# Patient Record
Sex: Female | Born: 1965 | Race: Black or African American | Hispanic: No | Marital: Single | Smoking: Never smoker
Health system: Southern US, Community
[De-identification: ages and names within clinical notes are randomized; demographics above are authoritative.]

## PROBLEM LIST (undated history)

## (undated) ENCOUNTER — Ambulatory Visit

---

## 1998-06-26 ENCOUNTER — Emergency Department (HOSPITAL_COMMUNITY): Admission: EM | Admit: 1998-06-26 | Discharge: 1998-06-26 | Payer: Self-pay | Admitting: Emergency Medicine

## 1998-11-05 ENCOUNTER — Encounter: Payer: Self-pay | Admitting: Emergency Medicine

## 1998-11-05 ENCOUNTER — Emergency Department (HOSPITAL_COMMUNITY): Admission: EM | Admit: 1998-11-05 | Discharge: 1998-11-05 | Payer: Self-pay | Admitting: Emergency Medicine

## 1999-03-10 ENCOUNTER — Other Ambulatory Visit: Admission: RE | Admit: 1999-03-10 | Discharge: 1999-03-10 | Payer: Self-pay | Admitting: Obstetrics

## 1999-05-10 ENCOUNTER — Ambulatory Visit (HOSPITAL_COMMUNITY): Admission: RE | Admit: 1999-05-10 | Discharge: 1999-05-10 | Payer: Self-pay | Admitting: Chiropractic Medicine

## 1999-05-10 ENCOUNTER — Encounter: Payer: Self-pay | Admitting: Chiropractic Medicine

## 1999-07-21 ENCOUNTER — Emergency Department (HOSPITAL_COMMUNITY): Admission: EM | Admit: 1999-07-21 | Discharge: 1999-07-21 | Payer: Self-pay

## 2003-01-03 ENCOUNTER — Emergency Department (HOSPITAL_COMMUNITY): Admission: EM | Admit: 2003-01-03 | Discharge: 2003-01-03 | Payer: Self-pay

## 2004-07-23 ENCOUNTER — Encounter: Payer: Self-pay | Admitting: Cardiovascular Disease

## 2004-07-23 ENCOUNTER — Inpatient Hospital Stay (HOSPITAL_COMMUNITY): Admission: EM | Admit: 2004-07-23 | Discharge: 2004-07-27 | Payer: Self-pay | Admitting: Emergency Medicine

## 2004-07-29 ENCOUNTER — Emergency Department (HOSPITAL_COMMUNITY): Admission: EM | Admit: 2004-07-29 | Discharge: 2004-07-29 | Payer: Self-pay | Admitting: Emergency Medicine

## 2004-08-02 ENCOUNTER — Inpatient Hospital Stay (HOSPITAL_COMMUNITY): Admission: EM | Admit: 2004-08-02 | Discharge: 2004-08-05 | Payer: Self-pay | Admitting: Emergency Medicine

## 2006-06-11 ENCOUNTER — Emergency Department (HOSPITAL_COMMUNITY): Admission: EM | Admit: 2006-06-11 | Discharge: 2006-06-11 | Payer: Self-pay | Admitting: Emergency Medicine

## 2008-01-21 ENCOUNTER — Ambulatory Visit (HOSPITAL_COMMUNITY): Admission: RE | Admit: 2008-01-21 | Discharge: 2008-01-21 | Payer: Self-pay | Admitting: Plastic Surgery

## 2008-01-22 ENCOUNTER — Encounter: Admission: RE | Admit: 2008-01-22 | Discharge: 2008-01-22 | Payer: Self-pay | Admitting: Plastic Surgery

## 2008-01-28 ENCOUNTER — Ambulatory Visit: Payer: Self-pay | Admitting: Vascular Surgery

## 2008-01-28 ENCOUNTER — Other Ambulatory Visit: Payer: Self-pay | Admitting: Emergency Medicine

## 2008-01-28 ENCOUNTER — Emergency Department (HOSPITAL_COMMUNITY): Admission: EM | Admit: 2008-01-28 | Discharge: 2008-01-28 | Payer: Self-pay | Admitting: Emergency Medicine

## 2008-01-28 ENCOUNTER — Encounter (INDEPENDENT_AMBULATORY_CARE_PROVIDER_SITE_OTHER): Payer: Self-pay | Admitting: Emergency Medicine

## 2008-01-28 ENCOUNTER — Ambulatory Visit (HOSPITAL_COMMUNITY): Admission: RE | Admit: 2008-01-28 | Discharge: 2008-01-28 | Payer: Self-pay | Admitting: Emergency Medicine

## 2008-01-31 ENCOUNTER — Inpatient Hospital Stay (HOSPITAL_COMMUNITY): Admission: EM | Admit: 2008-01-31 | Discharge: 2008-02-07 | Payer: Self-pay | Admitting: Emergency Medicine

## 2008-01-31 ENCOUNTER — Ambulatory Visit: Payer: Self-pay | Admitting: Surgery

## 2008-01-31 ENCOUNTER — Encounter (INDEPENDENT_AMBULATORY_CARE_PROVIDER_SITE_OTHER): Payer: Self-pay | Admitting: Internal Medicine

## 2008-03-28 ENCOUNTER — Ambulatory Visit: Payer: Self-pay | Admitting: Internal Medicine

## 2008-03-28 DIAGNOSIS — E669 Obesity, unspecified: Secondary | ICD-10-CM | POA: Insufficient documentation

## 2008-03-28 DIAGNOSIS — Z8672 Personal history of thrombophlebitis: Secondary | ICD-10-CM

## 2008-04-07 ENCOUNTER — Telehealth: Payer: Self-pay | Admitting: Internal Medicine

## 2008-04-11 ENCOUNTER — Ambulatory Visit: Payer: Self-pay | Admitting: Internal Medicine

## 2008-04-11 LAB — CONVERTED CEMR LAB
INR: 2.5
Prothrombin Time: 19.3 s

## 2008-05-08 ENCOUNTER — Ambulatory Visit: Payer: Self-pay | Admitting: Internal Medicine

## 2008-05-08 LAB — CONVERTED CEMR LAB
INR: 3.6
Prothrombin Time: 22.8 s

## 2008-05-17 ENCOUNTER — Encounter (INDEPENDENT_AMBULATORY_CARE_PROVIDER_SITE_OTHER): Payer: Self-pay | Admitting: Emergency Medicine

## 2008-05-17 ENCOUNTER — Ambulatory Visit: Payer: Self-pay | Admitting: Vascular Surgery

## 2008-05-17 ENCOUNTER — Emergency Department (HOSPITAL_COMMUNITY): Admission: EM | Admit: 2008-05-17 | Discharge: 2008-05-17 | Payer: Self-pay | Admitting: Emergency Medicine

## 2008-05-17 ENCOUNTER — Telehealth: Payer: Self-pay | Admitting: Family Medicine

## 2008-05-22 ENCOUNTER — Ambulatory Visit: Payer: Self-pay | Admitting: Internal Medicine

## 2008-05-22 LAB — CONVERTED CEMR LAB
INR: 2.2
Prothrombin Time: 18 s

## 2008-06-12 ENCOUNTER — Telehealth: Payer: Self-pay | Admitting: Internal Medicine

## 2008-06-12 ENCOUNTER — Ambulatory Visit: Payer: Self-pay | Admitting: Internal Medicine

## 2008-06-12 LAB — CONVERTED CEMR LAB
INR: 2.9
Prothrombin Time: 20.4 s

## 2008-07-10 ENCOUNTER — Ambulatory Visit: Payer: Self-pay | Admitting: Internal Medicine

## 2008-07-10 DIAGNOSIS — R5383 Other fatigue: Secondary | ICD-10-CM

## 2008-07-10 DIAGNOSIS — R5381 Other malaise: Secondary | ICD-10-CM

## 2008-07-10 LAB — CONVERTED CEMR LAB: TSH: 0.73 microintl units/mL (ref 0.35–5.50)

## 2009-02-03 ENCOUNTER — Encounter: Admission: RE | Admit: 2009-02-03 | Discharge: 2009-02-03 | Payer: Self-pay | Admitting: Plastic Surgery

## 2010-10-16 ENCOUNTER — Encounter: Payer: Self-pay | Admitting: Internal Medicine

## 2010-10-16 DIAGNOSIS — I82409 Acute embolism and thrombosis of unspecified deep veins of unspecified lower extremity: Secondary | ICD-10-CM

## 2011-02-15 NOTE — H&P (Signed)
NAMEGENISE, STRACK              ACCOUNT NO.:  [id number]   MEDICAL RECORD NO.:  [id number]          PATIENT TYPE:  EMS   LOCATION:  ED                           FACILITY:  [facility]   PHYSICIAN:  Lonia Blood, M.D.DATE OF BIRTH:  Nov 26, 1965   DATE OF ADMISSION:  01/31/2008  DATE OF DISCHARGE:                              HISTORY & PHYSICAL   PRIMARY CARE PHYSICIAN:  Unassigned.   CHIEF COMPLAINT:  Left lower extremity DVT with pain.   HISTORY OF PRESENT ILLNESS:  Ms. Kelly Flowers is a pleasant 45-year-  old obese female who underwent an elective tummy tuck and breast  reduction January 23, 2008, in [place].  In the postoperative period, she  began to note significant swelling in the left lower extremity.  She  presented to the [facility] emergency room January 28, 2008, at which time  she was diagnosed via Doppler with a left lower extremity DVT.  CT scan  was obtained and despite two attempts, suboptimal images were obtained.  The radiologist was able to comment that no very large central pulmonary  emboli were noted, but peripheral pulmonary emboli could not be ruled  out.  For reasons that are not all clear, the patient was discharge from  the ER on no anticoagulation and simply told to elevate her leg.  She  has followed these instructions, but not surprisingly her leg continues  to swell further, and she is describing severe pain of the leg.  She  therefore presented back to the emergency room tonight.  She  specifically denies chest pain but she does report short of breath that  is not normal for her.  She denies fevers, chills, nausea, vomiting,  hemoptysis, hematochezia, melena.  She denies severe pain at her  surgical sites and states that her wounds are healing nicely.   REVIEW OF SYSTEMS:  As comprehensive review of systems is unremarkable  with the exception of multiple positive elements noted in his present of  illness above.   PAST MEDICAL HISTORY:  1. Status  post abdominoplasty and breast reduction January 23, 2008.  2. Status post C-section  3. Status post umbilical hernia repair as a child.   OUTPATIENT MEDICATIONS:  Tylox p.r.n., Zofran p.r.n.   ALLERGIES:  NO KNOWN DRUG ALLERGIES.   FAMILY HISTORY:  The patient's mother died in her 76s due to coronary  artery disease and also had diabetes.  The patient's father is alive but  has hypertension.   SOCIAL HISTORY:  The patient does not smoke.  She does not drink.  She  lives in [place].  She is a day Occupational hygienist.   DATA REVIEWED:  Laboratory values are all pending at the present time.  CT scan of chest January 28, 2008:  Poor opacification of pulmonary  arterial tree, but no obvious central pulmonary embolism.   PHYSICAL EXAMINATION:  VITAL SIGNS:  Temperature 94, blood pressure  130/75, heart rate 91, respiratory 20, O2 sats 98% on room air.  GENERAL:  Well-developed, well-nourished obese female in no acute  respiratory distress.  LUNGS:  Clear to auscultation bilaterally without  wheezes or rhonchi.  CARDIOVASCULAR: Regular rate and rhythm without murmur, gallop or rub  with normal S1-S2.  ABDOMEN:  Obese, soft bowel sounds present.  No hepatosplenomegaly, no  rebound or ascites.  EXTREMITIES:  A 2+ bilateral lower extremity edema to the mid thighs.  There is pain to palpation across the left lower extremity.  There is no  appreciable erythema.  There is no cutaneous disruption/wound.   IMPRESSION AND PLAN:  1. Left lower extremity DVT with possible pulmonary embolism.  It is      not clear why this patient was sent out without anticoagulation.      Nonetheless, she clearly needs to be anticoagulated.  Not only does      she have a DVT, but we cannot conclusively rule out the possibility      of pulmonary embolism.  Given the patient's new shortness of breath      of approximately 2-3 days duration, I am concerned that there may      in fact be peripheral pulmonary emboli.  I am  placing the patient      on heparin drip for full anticoagulation.  She will need a minimum      of 3 months of full anticoagulation and possibly 6, should her VQ      scan be positive.  Given the fact that a CT angio of the chest was      accomplished x2 on January 28, 2008, and even with this attempt      pulmonary embolism cannot be ruled out, I will attempt to evaluate      the VQ scan.  2. Recent abdominoplasty and breast reduction.  The patient appears to      be suffering with no significant side effects from the surgery      apart from her DVT and possible PE.      Lonia Blood, M.D.  Electronically Signed     JTM/MEDQ  D:  01/31/2008  T:  01/31/2008  Job:  [id number]

## 2011-02-15 NOTE — Discharge Summary (Signed)
NAMEMALLORI, Kelly Flowers              ACCOUNT NO.:  [id number]   MEDICAL RECORD NO.:  [id number]          PATIENT TYPE:  INP   LOCATION:  1423                         FACILITY:  [facility]   PHYSICIAN:  Herbie Saxon, MDDATE OF BIRTH:  12/27/1965   DATE OF ADMISSION:  01/31/2008  DATE OF DISCHARGE:                               DISCHARGE SUMMARY   DISCHARGE DIAGNOSES:  1. Left leg deep venous thrombosis, intermediate probability pulmonary      embolus, on Lovenox.  2. Status post abdominoplasty and breast reduction on January 23, 2008.  3. Anemia, normocytic/normochromic.  4. Vaginal bleeding, stabilized.  5. Morbid obesity.   RADIOLOGY:  V/Q scan of January 31, 2008 shows multiple small subsegmental  perfusion defects representing intermediate probability of pulmonary  embolism.   Chest x-ray of January 31, 2008 shows cardiomegaly.  There is no heart  failure, no edema or effusion.   CT angiogram of January 28, 2008 showed small bilateral pleural effusions.  No overt pulmonary edema.  No obvious large central pulmonary embolus  seen.   HOSPITAL COURSE:  This 45 year old African-American lady was status post  abdominoplasty and breast reduction on January 23, 2008 in [place]  presented to the [facility] Emergency Room on January 28, 2008 with left  leg swelling with a Doppler showing a left leg DVT.  Patient was started  initially on IV heparin.  This was later changed to Lovenox.  The  patient was noticed to have transient vaginal bleeding for two days, but  this has subsided.   Patient does not have medical insurance coverage, and the clinical  social worker helped her to get assistance for Lovenox coverage, as the  surgeon contacted advised it would not be safe to start Coumadin until  10 days after this admission, so the patient has been kept on Lovenox  for 10 days and then will be bridged with Coumadin after the 10 days.  Goal INR is 2-3.  It is to monitored by the primary care  physician, Dr.  Lonia Blood, who will monitor the PT/INR twice weekly for the first two  weeks and then subsequently weekly with bleeding.   DISCHARGE INSTRUCTIONS:  Stable.   DIET:  Low cholesterol.   ACTIVITY:  Increase slowly as tolerated.   FOLLOW UP:  With Dr. Mikeal Hawthorne in three days.   MEDICATIONS ON DISCHARGE:  1. Lovenox 110 mg subcu q.12h.  2. Coumadin 5 mg nightly, to start on Feb 11, 2008.  3. Vicodin 5/500 1 q.6h. p.r.n.  4. Hemocyte Plus 1 daily.   PHYSICAL EXAMINATION:  On examination, she is a young lady not in acute  distress.  Temperature is 98, pulse 76, respiratory rate 18, blood pressure 113/78.  Pupils are equal and reactive to light and accommodation.  Head is  normocephalic and atraumatic.  She is clinically pale, not jaundiced.  Oropharynx and nasopharynx are clear.  NECK:  Supple.  CHEST:  Clinically clear.  Reduced breath sounds at the bases.  ABDOMEN:  Benign.  She has truncal obesity.  She is alert and oriented to time, place, and person.  Cranial nerves II-  XII are intact.  Power is 5 globally.  Peripheral pulses are present.  No pedal edema.  No calf tenderness.  Homans sign negative.  Deep tendon  reflexes 2+.   Patient is asked to follow up with her gynecologist as needed in the  next 1-2 weeks.  This will be arranged by the primary care physician.   AVAILABLE LABS:  WBC 6, hematocrit 26, platelet count 390.  Chemistry  shows a sodium of 137, potassium 3.8, chloride 103, bicarbonate 29,  glucose 102, BUN 10, creatinine 0.6.   Discharged within 30 minutes.      Herbie Saxon, MD  Electronically Signed     MIO/MEDQ  D:  02/07/2008  T:  02/07/2008  Job:  [id number]

## 2011-02-18 NOTE — Consult Note (Signed)
Kelly Flowers, Kelly Flowers              ACCOUNT NO.:  [id number]   MEDICAL RECORD NO.:  [id number]          PATIENT TYPE:  INP   LOCATION:  0350                         FACILITY:  [facility]   PHYSICIAN:  Thereasa Solo. Little, M.D. DATE OF BIRTH:  Sep 04, 1966   DATE OF CONSULTATION:  07/24/2004  DATE OF DISCHARGE:                                   CONSULTATION   REASON FOR CONSULTATION:  Chest pain.   This 45 year old female was admitted to [facility] on July 23, 2004.  She was admitted with chest pain.  About two days prior to this, she  had noticed a heavy tight sensation in her chest that was intermittent  in  nature.  It did not intensify with physical activity.  It began to radiate  into her upper shoulder and down her left arm and was associated with more  breathlessness.  She tried to go to the gym to do her normal activities, but  found herself markedly more breathless.  She presented to the emergency room  and since her admission she has had only mild recurrent chest discomfort,  but still is having some intermittent chest tightness on the day of this  consultation.  Her EKG has been normal and her cardiac enzymes have been  unremarkable.   A 2-D echocardiogram showed normal LV function, mild mitral regurgitation  and minimal calcification on the aortic valve.   She does not smoke cigarettes.  She is not hypertensive or diabetic, but she  does have a family history of a mother who died in her 2s of coronary  disease.   She has had no peripheral swelling.  Unaware of any arrhythmias.  She has  had no dizziness, no syncope and no prior history of a heart murmur or  rheumatic fever.   ALLERGIES:  None.   MEDICATIONS:  None.   FAMILY HISTORY:  Mother died in her 69s of an MI and was also a diabetic.  Father with hypertension.   SOCIAL HISTORY:  She has three children.  She runs a daycare center.  No  alcohol.  Exercises at a gym on a regular basis with no real  limitations.   REVIEW OF SYSTEMS:  Normal bowel habits.  No peripheral edema.  No  claudication complaints.  Mild back pain intermittently.   OUTPATIENT MEDICATIONS:  None.   PHYSICAL EXAMINATION:  VITAL SIGNS:  Blood pressure 109/73, pulse 65 and  regular, respiratory rate 18.  She is afebrile.  GENERAL APPEARANCE:  The patient is having no acute distress, but does have  intermittent episodes of mild chest discomfort.  She is pain-free at the  time of my evaluation.  SKIN:  Warm and dry.  NECK:  Supple with no carotid bruits.  Her thyroid is nonpalpable.  LUNGS:  Completely clear with no wheezing, rhonchi or rales.  CARDIAC:  Regular rhythm with no ectopic beats.  No rubs.  I did not  appreciate a murmur at the apex or in the axillae.  ABDOMEN:  Soft with no epigastric tenderness.  EXTREMITIES:  She has good pulses in the lower  extremities.  There is no  edema.  NEUROLOGIC:  Grossly intact.   LABORATORY DATA:  Review of her laboratory work shows her hemoglobin to be  11.7 and white blood cell count 5100.  BUN 8, creatinine 0.9.  CKs and  troponins x 3 have all been normal.  LDL is 61, HDL is 71.  Chest x-ray with  mild cardiomegaly.   ASSESSMENT:  Chest pain somewhat suspicious for angina, but in a 45 year old  with the only risk factor family history I cannot make the case to do a  cardiac catheterization.  I have scheduled her for a nuclear study and I  will keep her in the hospital until this is performed because of her  continued vague chest discomfort.   I have discussed this in detail with the patient and she understands the  plan.      ABL/MEDQ  D:  07/24/2004  T:  07/24/2004  Job:  [id number]

## 2011-02-18 NOTE — H&P (Signed)
Kelly Flowers, Kelly Flowers              ACCOUNT NO.:  [id number]   MEDICAL RECORD NO.:  [id number]          PATIENT TYPE:  EMS   LOCATION:  MAJO                         FACILITY:  [facility]   PHYSICIAN:  Isidor Holts, M.D.  DATE OF BIRTH:  25-Jul-1966   DATE OF ADMISSION:  08/02/2004  DATE OF DISCHARGE:                                HISTORY & PHYSICAL   PRIMARY MEDICAL DOCTOR:  Unassigned.   CARDIOLOGIST:  Dr. Gaspar Garbe B. Little.   CHIEF COMPLAINT:  Painful swelling, right leg, approximately 3-5 days.   HISTORY OF PRESENT ILLNESS:  This is a 45 year old African American female  admitted to [facility], July 23, 2004 to July 27, 2004, for  chest pain, subsequently found to be noncardiac.  She is status post  negative coronary angiogram, July 27, 2004.  She was discharged in  satisfactory condition and was asked to rest and then gradually resume  normal activities.  On July 28, 2004, she developed tingling of her  right leg, called Dr. Clarene Duke, who reassured her, as there was no obvious  swelling.  However, by July 29, 2004, the leg was now swollen and  painful.  She went to the emergency room and was prescribed  Keflex/analgesics for possible cellulitis.  The swelling continued, however,  and symptoms remained unabated.  She called Dr. Clarene Duke again on August 01, 2004 and was referred to the emergency room.  She denies chest pain or  shortness of breath.   PAST MEDICAL HISTORY:  1.  Noncardiac chest pain, July 22, 2004, admitted to [facility], July 23, 2004 to July 27, 2004, negative workup      including cardiac catheterization.  2.  Mild iron deficiency anemia.  3.  Low back pain.   MEDICATIONS:  1.  Hydrocodone/APAP.  2.  Keflex 500 mg p.o. 4 times daily started July 29, 2004.   ALLERGIES:  No known drug allergies.   SOCIAL HISTORY:  Lives in [place], has three offspring, denies alcohol  use, is a nonsmoker, has no history of  drug abuse.   FAMILY HISTORY:  Family history positive for coronary artery disease in her  mother, who was diabetic; her mother died in her 74s, status post MI.  Her  father has hypertension.   REVIEW OF SYSTEMS:  Systems review as per HPI and chief complaint,  otherwise, negative.   PHYSICAL EXAMINATION:  VITALS:  Temperature 97.0, pulse 75 per minute and  regular, respiratory rate 19, BP 121/73 mmHg, oxygen saturation 99% on room  air.  GENERAL:  The patient appears comfortable, alert, communicative, not short  of breath at rest.  HEENT:  No clinical pallor, no jaundice, no conjunctival injection.  Throat  is quite clear.  NECK:  Neck is supple with JVP not seen.  No palpable lymphadenopathy.  No  palpable goiter.  CHEST:  Chest is clinically clear to auscultation.  No wheezes or crackles.  CARDIAC:  Heart sounds 1 and 2 heard, normal.  No murmurs.  ABDOMEN:  Abdomen is full, soft and nontender.  There is no palpable  organomegaly, no palpable masses, normal bowel sounds.  EXTREMITIES:  Lower extremity examination:  No obvious bruising at the  femoral region.  Right thigh appears normal compared to the left.  Right  calf is swollen and tender.  There is no increased local temperature or  redness.   INVESTIGATIONS:  Arterial Doppler done in the emergency room showed no  evidence of aneurysm, reportedly.   CBC:  WBC 6.9, hemoglobin 11.6, hematocrit 33.4, platelets 241,000.  D-  dimers elevated at 1.62.   ASSESSMENT AND PLAN:  Clinically, features are consistent with right leg  deep venous thrombosis.  The main differential is of course a possible right  thigh hematoma, however, physical examination shows no clinical features to  support this.  She is being admitted for observation, workup and management.  We shall continue antibiotics for possible cellulitis, i.e., Ancef 1 g  intravenously q.6h. and start the patient on Lovenox in a weight-based  dosage.  Venous Doppler has  been arranged to rule out deep venous thrombosis  and further management will depend on the findings.      Chri   CO/MEDQ  D:  08/02/2004  T:  08/02/2004  Job:  [id number]   cc:   Thereasa Solo. Little, M.D.  [address]  [address]  [place]  [place] [address]  Fax: [phone]

## 2011-02-18 NOTE — H&P (Signed)
NAME:  Kelly Flowers, Kelly Flowers              ACCOUNT NO.:  [id number]   MEDICAL RECORD NO.:  [id number]          PATIENT TYPE:  EMS   LOCATION:  ED                           [facility]AC[facility]   PHYSICIAN:  Lonia Blood, M.D.      DATE OF BIRTH:  December 11, 1965   DATE OF ADMISSION:  07/22/2004  DATE OF DISCHARGE:                                HISTORY & PHYSICAL   PRIMARY CARE PHYSICIAN:  The patient is unassigned.   PRESENTING COMPLAINT:  Chest pain for two days.   HISTORY OF PRESENT ILLNESS:  This is a 45 year old, African-American female  with no significant past medical history who presented with chest pain for  the past two days.  The patient described pain as 7/10, mainly in the  precordial region.  Described it more as a squeezing rather than sharp,  occasionally radiating down her left arm.  Has some elements of diaphoresis  with some nausea.  Pain is present both at rest as well as with exertion.  She denied any relieving or aggravating factor.  Associated with some  shortness of breath with exertion today.  However, that has since resolved.  The patient also described some elements of pain radiating to the back and  occasionally with deep inspiration.  This is the first ever episode of chest  pain in the patient.  Her risk factors for cardiac disease include family  history, unknown cholesterol status, as well as obesity.   PAST MEDICAL HISTORY:  Mainly significant for low back pain.  Denied any  history of diabetes or hypertension.   ALLERGIES:  The patient has no known drug allergies.   FAMILY HISTORY:  Her mother died in her 4s from MI and also had diabetes.  Her father has hypertension.   SOCIAL HISTORY:  The patient lives in [place] with her three kids.  Denied any tobacco use or alcohol use.  The patient does go to the gym  occasionally.  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   REVIEW OF SYSTEMS:  GENERAL:  The patient denied any weight gain or weight  loss.   Denied any fever.  CHEST:  Mainly as in HPI.  HEENT:  The patient  denied any diplopia, any jaundice, or any discoloration of the eye.  Also  denied any problem with her throat swallowing function.  CARDIOVASCULAR:  Mainly shortness of breath.  No palpitations, no orthopnea, no PND.  ABDOMEN:  The patient denied any diarrhea or constipation.  Mainly nausea,  as mentioned, but no vomiting, no hematochezia, no melena.  GENITOURINARY:  The patient denied any discharge.  No frequency.  No other urinary symptoms.  MUSCULOSKELETAL:  The patient denied any joint pain, any leg swelling or  muscle aches.   PHYSICAL EXAMINATION:  TEMPERATURE:  97.3.  BLOOD PRESSURE:  120/80.  PULSE:  71.  RESPIRATORY RATE:  20.  SATURATIONS:  97% on room air.  GENERAL:  The patient is obese, with a BMI of 42, stable, conversant, in no  acute distress.  NECK:  Supple, with no JVD, no lymphadenopathy.  HEENT:  PERRL.  EOMI.  CHEST:  Has good air entry bilaterally.  No crackles, no rales, no wheeze.  CARDIOVASCULAR:  Regular S1 and S2, sinus regular.  No audible rubs.  ABDOMEN:  Obese, nontender, with positive bowel sounds.  GENITOURINARY:  Deferred.  EXTREMITIES:  Showed no edema, cyanosis, or clubbing.  SKIN:  Warm and moist.  NEUROLOGIC:  Nonfocal.   LABORATORIES:  White count 5.1, hemoglobin 11.7, platelet count 286.  Sodium  113, potassium 3.6, chloride 108, CO2 27, glucose 93, BUN 8, creatinine 0.9,  calcium 8.8.  Initial cardiac enzymes showed negative, with a troponin of  less than 0.05 x 2.  Her EKG showed elements of sinus bradycardia, with a  rate of 58.  Her PR interval is mildly elevated to 226, indicating first-  degree AV block.  Normal QRS duration.  Normal QT interval.  Diffuse ST  segment elevation all over the precordium, which may represent either an ST  elevation or preexcitation.   ASSESSMENT:  Therefore, this is a 45 year old family with family history  significant for early coronary  artery disease, unknown cholesterol status,  as well as obesity, who presented with unrelenting chest pain, mainly in the  precordium.  Also, the patient has subtle EKG changes that may represent  some elements of pericarditis or normal findings.  The most likely diagnosis  in the patient would be pericarditis.  She works in a day care center, which  may have exposed her to some viral illness.  The patient denied any exposure  or recent viral illness.  However, this may not be uncommon.  Other  likelihood is an MI, as patient with strong cardiac risk factors.  Will  therefore admit patient at least to rule out MI in the patient.   PLAN:  1.  Chest pain.  Admit to telemetry.  Check cardiac enzymes serially to rule      out MI.  Repeat EKG in the morning.  Obtain 2D echocardiogram, both for      patient's chest x-ray that showed mild cardiomegaly, as well as rule out      pericarditis in this case.  Meanwhile, treat empirically with some IV      morphine, aspirin, and give her some NSAID like ibuprofen t.i.d. at      least for possible pericarditis.  Cover with some Protonix for her GI      protection on these drugs.  I will also go ahead and give her some      nitroglycerin, since her pain seems to be relieved.  However, because of      her borderline blood pressure, try and use either sublingual or use the      nitro paste.  I will also load with beta blocker on a small dose, also      to be held based on her blood pressure responses.  In the meantime,      however, I will avoid using heparin in patient in case it turns out that      she has pericarditis.  Will consider calling GI in the morning for      further risk stratification in case the patient turns out not to have      pericarditis and she has negative cardiac enzymes.  2.  Mild anemia.  The patient's hemoglobin is 11.7.  However, the patient is      a menstruating young lady.  This could be responsible for this.  I will  therefore not make any significant change to her current medications      since anemia is mild.  That could be worked up as an outpatient, unless      evidence of drop in her hemoglobin.  3.  Obesity.  The patient is mildly obese.  However, the patient says that      she has been participating in some exercise at the gym.  Will try and      encourage the patient to continue with that.  Meanwhile, will check her      fasting lipid panel during this hospitalization, and as mentioned will      proceed with some risk stratification.      LG/MEDQ  D:  07/23/2004  T:  07/23/2004  Job:  [id number]

## 2011-02-18 NOTE — Discharge Summary (Signed)
Kelly Flowers, Kelly Flowers              ACCOUNT NO.:  [id number]   MEDICAL RECORD NO.:  [id number]          PATIENT TYPE:  INP   LOCATION:  3728                         FACILITY:  [facility]   PHYSICIAN:  Jonna L. Robb Matar, M.D.DATE OF BIRTH:  [birth year]   DATE OF ADMISSION:  07/23/2004  DATE OF DISCHARGE:  07/27/2004                                 DISCHARGE SUMMARY   PRIMARY CARE PHYSICIAN:  Unassigned.   CARDIOLOGY:  [facility].   FINAL DIAGNOSES:  1.  Noncardiac chest pain.  2.  Mild iron deficiency anemia.   PROCEDURE:  Cardiac catheterization on October 24.   ALLERGIES:  None.   CODE STATUS:  Full.   HISTORY OF PRESENT ILLNESS:  This 45 year old nonsmoker developed precordial  chest pain radiating down her left arm with some nausea, diaphoresis, some  dyspnea on exertion the day of admission.  She had a positive family history  of her mother dying in her 58s from an MI and diabetes.  Father had  hypertension; however, the patient herself has neither of those risk factors  and does not smoke.   PHYSICAL EXAMINATION:  Notable for a BMI of 42 and is otherwise  unremarkable.   LABORATORY DATA:  Cardiac enzymes were negative.  EKG showed mild sinus  bradycardia to first-degree heart block, diffuse ST segment mild elevations  which on examination is just a high J point.   HOSPITAL COURSE:  The patient was admitted to telemetry.  She was treated  with morphine, aspirin, anti-inflammatories, PPI, and low-dose beta blocker.  She was seen in consultation by [facility].  Catheterization on  October 24 was completely negative.  Telemetry was unremarkable.   DISPOSITION:  The patient will be discharged on coated aspirin 81 mg daily,  omeprazole 20 mg daily p.r.n.  He is on a vegetarian diet, has lost 50  pounds, and exercises, though I have told her to continue all of her good  health habits.  I have also suggested that she arrange to get a PCP.      JLB/MEDQ  D:  07/27/2004  T:  07/27/2004  Job:  [id number]

## 2011-02-18 NOTE — Cardiovascular Report (Signed)
NAMEMARLI, Kelly Flowers              ACCOUNT NO.:  [id number]   MEDICAL RECORD NO.:  [id number]          PATIENT TYPE:  INP   LOCATION:  3731                         FACILITY:  [facility]   PHYSICIAN:  Thereasa Solo. Little, M.D. DATE OF BIRTH:  1965/10/30   DATE OF PROCEDURE:  07/26/2004  DATE OF DISCHARGE:                              CARDIAC CATHETERIZATION   INDICATIONS FOR TEST:  This 45 year old female was admitted to [facility], July 22, 2004, with chest pain.  Her chest pain was  intermittent in nature but squeezing, radiating up into her left shoulder  and down her left arm.  It was plus/minus worse with exertion.  She had been  able to exercise at the gym days prior to this with no problems but has  noticed a slight change and some mild dyspnea on exertion.  She has a family  history of heart disease.  Her mother died at age 59 of a myocardial  infarction.  This lady does not smoke, is not diabetic or hypertensive.  Her  lipid panel is appropriate.  And she continued to have chest pain  intermittently throughout her hospitalization.   The patient was prepped and draped in the usual sterile fashion after  obtaining informed consent.  Local anesthetic with 1% Xylocaine was  infiltrated into the right femoral area, and a 5 French introducer sheath  was placed into the right femoral artery.  Left and right coronary  arteriography and ventriculography in the RAO projection was performed.   COMPLICATIONS:  None.   EQUIPMENT:  5 French Judkins configuration catheters.   RESULTS:  1.  Hemodynamic monitoring central aortic pressure 120/74.  2.  Left ventricular pressure 120/3 with no aortic valve gradient noted at      time of pullback.  3.  Ventriculography.  Ventriculography in the RAO projection using 25 mL of      contrast at 12 mL per second showed normal LV systolic function,      ejection fraction in excess of 55%.  The left ventricular end-diastolic      pressure was  9.  PVCs were noted during the ventriculogram.  4.  Coronary arteriography:  No calcification was seen on fluoroscopy.   1.  Left main normal.  2.  LAD.  The LAD crossed the apex of the heart and gave rise to a moderate      size first diagonal vessel.  It was all free of disease.  3.  Circumflex.  The circumflex was a large left dominant system, had 3 OMs      and a PDA, all of which were free of disease.  4.  Right coronary artery.  The right coronary artery gave rise to a large      RV branch but was still nondominant.  There was no significant disease.      There was minimal coronary spasm - catheter-induced noted.  This      resolved, and cuff shot showed no proximal lesion.   CONCLUSION:  1.  Normal left ventricular systolic function.  2.  No evidence of coronary artery  disease.   At this point, I cannot explain her chest pain based on her coronary  anatomy.  She should be ready for discharge later today if [facility] team  feels this is appropriate.  She will be reevaluated tomorrow from a cardiac  cath/groin standpoint with regular follow up with her primary care doctor.       ABL/MEDQ  D:  07/26/2004  T:  07/26/2004  Job:  [id number]   cc:   [facility]   Cath Lab

## 2011-02-18 NOTE — Discharge Summary (Signed)
Kelly Flowers, Kelly Flowers              ACCOUNT NO.:  [id number]   MEDICAL RECORD NO.:  [id number]          PATIENT TYPE:  INP   LOCATION:  5040                         FACILITY:  [facility]   PHYSICIAN:  Michaelyn Barter, M.D. DATE OF BIRTH:  [birth year]   DATE OF ADMISSION:  08/02/2004  DATE OF DISCHARGE:  08/05/2004                                 DISCHARGE SUMMARY   CHIEF COMPLAINT:  Swelling and pain within the right leg for approximately 3-  5 days.   HISTORY OF PRESENT ILLNESS:  Kelly Flowers is a 45 year old female recently  discharged from [facility] on October 25 secondary to undergoing a  cardiac catheterization for evaluation of chest pain.  She states that  following her discharge from the hospital on October 26, she developed  tingling of her right leg.  She called Dr. Clarene Duke, her cardiologist, for  reassurance.  On October 27, the following day, her leg began to swell and  was painful.  She went to the ER and was prescribed Keflex, along with  analgesics for a possible cellulitis.  However, the swelling continued, and  again, Dr. Clarene Duke was called.  At that time, she was referred to the  emergency room for further evaluation.   PAST MEDICAL HISTORY:  1.  Mild iron deficiency anemia.  2.  Low back pain.  3.  Noncardiac chest pain.   PAST SURGICAL PROCEDURES:  Cardiac catheterization completed on October 24.   ALLERGIES:  No known drug allergies.   SOCIAL HISTORY:  The patient denies alcohol.  She also denies cigarettes.   FAMILY HISTORY:  Mother coronary artery disease and diabetes.  Died in her  81's of an MI.  Father has history of hypertension.   HOSPITAL COURSE:  The patient was admitted into the hospital with a  differential diagnosis of right leg pain secondary to deep vein thrombosis  versus cellulitis.  She was treated empirically with IV antibiotics for  possible cellulitis.  She was given Ancef 1 g IV q.6h.  Likewise, she was  also started on Lovenox  therapy for a possible deep vein thrombosis.  On  October 31, the patient underwent a venous Duplex of the lower extremity,  the final impression of which was no evidence of DVT, SVT or Baker's cyst  bilaterally.  Later that day, the patient reported that she noted the  swelling within her leg had begun to decrease.  However, she did report pain  still.  By November 2, the patient stated that her leg was starting to feel  much better.  The pain had decreased, and she actually had begun to walk  around.  Today, the day of discharge, she states that she feels much better.  She states that her pain is almost completely controlled with the current  pain medication, and she is ready to go home.  Her condition at the time of  discharge is improved.  Her right leg appears to be less swollen than  previously.  Her vitals today:  Temperature 98.4, heart rate 73,  respirations 18, blood pressure 100/69.  The patient will be  discharged home  today.   DISCHARGE MEDICATIONS:  1.  Augmentin 875 mg p.o. b.i.d. which she will take for 10 days.  2.  Ultram 50 mg p.o. q.8h, which she will take for pain.   DISCHARGE INSTRUCTIONS:  The patient is instructed to find a primary care  physician for further evaluation and follow up, to get plenty of rest and to  drink plenty of fluids.      Orla   OR/MEDQ  D:  08/05/2004  T:  08/06/2004  Job:  [id number]   cc:   Thereasa Solo. Little, M.D.  [address]  [address]  [place]  [place] [address]  Fax: [phone]

## 2011-06-28 LAB — DIFFERENTIAL
Basophils Absolute: 0
Basophils Absolute: 0
Basophils Relative: 0
Basophils Relative: 0
Eosinophils Absolute: 0.1
Eosinophils Absolute: 0.2
Eosinophils Relative: 2
Eosinophils Relative: 3
Lymphocytes Relative: 28
Lymphocytes Relative: 21
Lymphs Abs: 2.1
Lymphs Abs: 1.3
Monocytes Absolute: 0.6
Monocytes Absolute: 0.1
Monocytes Relative: 8
Monocytes Relative: 2 — ABNORMAL LOW
Neutro Abs: 4.6
Neutro Abs: 4.8
Neutrophils Relative %: 62
Neutrophils Relative %: 75

## 2011-06-28 LAB — CBC
HCT: 28.7 — ABNORMAL LOW
HCT: 29.5 — ABNORMAL LOW
Hemoglobin: 9.2 — ABNORMAL LOW
Hemoglobin: 9.4 — ABNORMAL LOW
MCHC: 32.2
MCHC: 31.9
MCV: 88.4
MCV: 89.1
Platelets: 370
Platelets: 343
RBC: 3.25 — ABNORMAL LOW
RBC: 3.31 — ABNORMAL LOW
RDW: 13.6
RDW: 13.7
WBC: 7.4
WBC: 6.5

## 2011-06-28 LAB — BASIC METABOLIC PANEL
BUN: 10
BUN: 4 — ABNORMAL LOW
CO2: 29
CO2: 29
Calcium: 8.8
Calcium: 8.6
Chloride: 103
Chloride: 106
Creatinine, Ser: 0.64
Creatinine, Ser: 0.76
GFR calc Af Amer: >60
GFR calc Af Amer: >60
GFR calc non Af Amer: >60
GFR calc non Af Amer: >60
Glucose, Bld: 102 — ABNORMAL HIGH
Glucose, Bld: 100 — ABNORMAL HIGH
Potassium: 3.8
Potassium: 3.6
Sodium: 137
Sodium: 139

## 2011-06-28 LAB — HEPARIN LEVEL (UNFRACTIONATED): Heparin Unfractionated: 1.18 — ABNORMAL HIGH

## 2011-06-28 LAB — PROTIME-INR
INR: 1.1
Prothrombin Time: 14.3

## 2011-06-28 LAB — APTT: aPTT: 53 — ABNORMAL HIGH

## 2011-06-28 LAB — D-DIMER, QUANTITATIVE: D-Dimer, Quant: 1.47 — ABNORMAL HIGH

## 2013-04-09 ENCOUNTER — Ambulatory Visit (INDEPENDENT_AMBULATORY_CARE_PROVIDER_SITE_OTHER): Payer: Self-pay | Admitting: General Practice

## 2016-10-06 ENCOUNTER — Other Ambulatory Visit: Payer: Self-pay | Admitting: Internal Medicine

## 2016-10-06 DIAGNOSIS — Z1231 Encounter for screening mammogram for malignant neoplasm of breast: Secondary | ICD-10-CM

## 2016-10-12 ENCOUNTER — Ambulatory Visit
Admission: RE | Admit: 2016-10-12 | Discharge: 2016-10-12 | Disposition: A | Discharge status: Home / Self Care | Payer: PRIVATE HEALTH INSURANCE | Source: Ambulatory Visit | Attending: Internal Medicine | Admitting: Internal Medicine

## 2016-10-12 DIAGNOSIS — Z1231 Encounter for screening mammogram for malignant neoplasm of breast: Secondary | ICD-10-CM

## 2016-10-13 ENCOUNTER — Ambulatory Visit: Payer: Self-pay

## 2018-07-14 ENCOUNTER — Encounter (HOSPITAL_COMMUNITY): Payer: Self-pay | Admitting: *Deleted

## 2018-07-14 ENCOUNTER — Emergency Department (HOSPITAL_COMMUNITY)
Admission: EM | Admit: 2018-07-14 | Discharge: 2018-07-14 | Disposition: A | Discharge status: Home / Self Care | Payer: Self-pay | Attending: Emergency Medicine | Admitting: Emergency Medicine

## 2018-07-14 ENCOUNTER — Emergency Department (HOSPITAL_COMMUNITY): Payer: Self-pay

## 2018-07-14 DIAGNOSIS — H1032 Unspecified acute conjunctivitis, left eye: Secondary | ICD-10-CM | POA: Insufficient documentation

## 2018-07-14 DIAGNOSIS — Z7901 Long term (current) use of anticoagulants: Secondary | ICD-10-CM | POA: Insufficient documentation

## 2018-07-14 DIAGNOSIS — R042 Hemoptysis: Secondary | ICD-10-CM | POA: Insufficient documentation

## 2018-07-14 LAB — CBC WITH DIFFERENTIAL/PLATELET
Abs Immature Granulocytes: 0.02 10*3/uL (ref 0.00–0.07)
BASOS ABS: 0 10*3/uL (ref 0.0–0.1)
Basophils Relative: 0 %
EOS PCT: 3 %
Eosinophils Absolute: 0.1 10*3/uL (ref 0.0–0.5)
HCT: 40.5 % (ref 36.0–46.0)
HEMOGLOBIN: 12.4 g/dL (ref 12.0–15.0)
Immature Granulocytes: 0 %
LYMPHS PCT: 46 %
Lymphs Abs: 2.2 10*3/uL (ref 0.7–4.0)
MCH: 28.4 pg (ref 26.0–34.0)
MCHC: 30.6 g/dL (ref 30.0–36.0)
MCV: 92.7 fL (ref 80.0–100.0)
Monocytes Absolute: 0.4 10*3/uL (ref 0.1–1.0)
Monocytes Relative: 8 %
NEUTROS PCT: 43 %
Neutro Abs: 2 10*3/uL (ref 1.7–7.7)
Platelets: 230 10*3/uL (ref 150–400)
RBC: 4.37 MIL/uL (ref 3.87–5.11)
RDW: 13.5 % (ref 11.5–15.5)
WBC: 4.8 10*3/uL (ref 4.0–10.5)
nRBC: 0 % (ref 0.0–0.2)

## 2018-07-14 LAB — BASIC METABOLIC PANEL
Anion gap: 8 (ref 5–15)
BUN: 16 mg/dL (ref 6–20)
CALCIUM: 9.1 mg/dL (ref 8.9–10.3)
CO2: 28 mmol/L (ref 22–32)
CREATININE: 0.89 mg/dL (ref 0.44–1.00)
Chloride: 107 mmol/L (ref 98–111)
GFR calc non Af Amer: >60 mL/min (ref >60)
Glucose, Bld: 102 mg/dL — ABNORMAL HIGH (ref 70–99)
Potassium: 3.9 mmol/L (ref 3.5–5.1)
SODIUM: 143 mmol/L (ref 135–145)

## 2018-07-14 MED ORDER — ERYTHROMYCIN 5 MG/GM OP OINT: TOPICAL_OINTMENT | OPHTHALMIC | 0 refills | Status: DC

## 2018-07-14 MED ORDER — TETRACAINE HCL 0.5 % OP SOLN
2.0000 [drp] | Freq: Once | OPHTHALMIC | Status: AC
  Administered 2018-07-14: 2 [drp] via OPHTHALMIC
  Filled 2018-07-14: qty 4

## 2018-07-14 MED ORDER — FLUORESCEIN SODIUM 1 MG OP STRP
1.0000 | ORAL_STRIP | Freq: Once | OPHTHALMIC | Status: AC
  Administered 2018-07-14: 1 via OPHTHALMIC
  Filled 2018-07-14: qty 1

## 2018-07-14 MED ORDER — ERYTHROMYCIN 5 MG/GM OP OINT
TOPICAL_OINTMENT | Freq: Once | OPHTHALMIC | Status: AC
  Administered 2018-07-14: 1 via OPHTHALMIC
  Filled 2018-07-14: qty 3.5

## 2018-07-14 NOTE — ED Provider Notes (Signed)
[facility]-EMERGENCY DEPT Provider Note   CSN: [id number] Arrival date & time: 07/14/18  7829     History   Chief Complaint Chief Complaint  Patient presents with  . Eye Pain    HPI Kelly Flowers is a 52 y.o. female had previous DVT off anticoagulation here presenting with left eye redness, spitting up blood-tinged sputum.  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  She denies any trauma to the eye or eye injury.  She does not wear any contact lenses.  Since yesterday, patient has been having some left eye redness.  She woke up this morning and has some discharge from the eye and blurry vision as well as eyelid swelling.  Also for the last several days she noticed spitting up some blood-tinged sputum.  She denies any coughing or vomiting.  Denies any trouble breathing or shortness of breath or chest pain.  Denies any leg swelling or calf pain.   The history is provided by the patient.    History reviewed. No pertinent past medical history.  Patient Active Problem List   Diagnosis Date Noted  . 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 FATIGUE 07/10/2008  . OBESITY 03/28/2008  . DEEP VENOUS THROMBOPHLEBITIS, LEFT, LEG, HX OF 03/28/2008    History reviewed. No pertinent surgical history.   OB History   None      Home Medications    Prior to Admission medications   Medication Sig Start Date End Date Taking? Authorizing Provider  warfarin (COUMADIN) 7.5 MG tablet Take 7.5 mg by mouth daily.      [provider]    Family History No family history on file.  Social History Social History   Tobacco Use  . Smoking status: Never Smoker  . Smokeless tobacco: Never Used  Substance Use Topics  . Alcohol use: Never    Frequency: Never  . Drug use: Not on file     Allergies   Patient has no allergy information on record.   Review of Systems Review of Systems  Eyes: Positive for  discharge and redness.  Respiratory: Positive for cough.   All other systems reviewed and are negative.    Physical Exam Updated Vital Signs BP (!) 147/96 (BP Location: Right Arm)   Pulse 63   Temp 98.1 F (36.7 C) (Oral)   Resp 18   SpO2 98%   Physical Exam  Constitutional: She is oriented to person, place, and time. She appears well-developed and well-nourished.  HENT:  Head: Normocephalic.  Mouth/Throat: Oropharynx is clear and moist.  Eyes: Pupils are equal, round, and reactive to light.  L conjunctiva slightly erythematous. No obvious corneal abrasion under fluorescein stain. Vision 20/20 bilaterally. Mild L upper and lower eyelid swelling, extra ocular movements intact   Cardiovascular: Normal rate, regular rhythm and normal heart sounds.  Pulmonary/Chest: Effort normal and breath sounds normal. No stridor. No respiratory distress. She has no wheezes.  Abdominal: Soft. Bowel sounds are normal. She exhibits no distension. There is no tenderness.  Musculoskeletal: Normal range of motion.  Neurological: She is alert and oriented to person, place, and time. No cranial nerve deficit. Coordination normal.  Skin: Skin is warm.  Psychiatric: She has a normal mood and affect.  Nursing note and vitals reviewed.    ED Treatments / Results  Labs (all labs ordered are listed, but only abnormal results are displayed) Labs Reviewed  CBC WITH DIFFERENTIAL/PLATELET  BASIC METABOLIC PANEL    EKG None  Radiology No results found.  Procedures Procedures (including critical care time)  Medications Ordered in ED Medications  erythromycin ophthalmic ointment (has no administration in time range)  tetracaine (PONTOCAINE) 0.5 % ophthalmic solution 2 drop (2 drops Right Eye Given 07/14/18 0758)  fluorescein ophthalmic strip 1 strip (1 strip Left Eye Given 07/14/18 0758)     Initial Impression / Assessment and Plan / ED Course  I have reviewed the triage vital signs and the  nursing notes.  Pertinent labs & imaging results that were available during my care of the patient were reviewed by me and considered in my medical decision making (see chart for details).    Kelly Flowers is a 52 y.o. female here with L eye pain and redness, cough with blood tinged sputum. No corneal abrasion on fluorescein stain, vision normal. I think likely viral vs early bacterial conjunctivitis. No evidence of periorbital or orbital cellulitis. 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 Patient not tachycardic or hypoxic. I think likely viral bronchitis and I doubt PE. Will get cbc, CXR.     9:12 AM CBC normal, CXR clear. Given erythromycin ointment. Stable for discharge   Final Clinical Impressions(s) / ED Diagnoses   Final diagnoses:  None    ED Discharge Orders    None       Charlynne Pander, MD 07/14/18 [phone]

## 2018-07-14 NOTE — Discharge Instructions (Signed)
Use erythromycin ointment three times daily for 5 days.   No driving for 2 days   See your doctor   Return to ER if you have worse eye redness and purulent drainage, trouble breathing, more blood in sputum

## 2018-07-14 NOTE — ED Triage Notes (Signed)
Pt complains of left eye pain since waking up this morning. Pt tried massaging eye with a cold rag, w/o relief. Pt states she also spit into the sink and saw blood. Pt states her mouth is dry. Pt states her vision is blurry and has difficulty opening eye. Pt denies drainage from eye.

## 2019-02-26 ENCOUNTER — Ambulatory Visit (HOSPITAL_BASED_OUTPATIENT_CLINIC_OR_DEPARTMENT_OTHER)
Admission: RE | Admit: 2019-02-26 | Discharge: 2019-02-26 | Disposition: A | Discharge status: Home / Self Care | Payer: 59 | Source: Ambulatory Visit | Attending: Emergency Medicine | Admitting: Emergency Medicine

## 2019-02-26 ENCOUNTER — Emergency Department (HOSPITAL_COMMUNITY)
Admission: EM | Admit: 2019-02-26 | Discharge: 2019-02-26 | Disposition: A | Discharge status: Home / Self Care | Payer: 59 | Attending: Emergency Medicine | Admitting: Emergency Medicine

## 2019-02-26 ENCOUNTER — Other Ambulatory Visit: Payer: Self-pay

## 2019-02-26 ENCOUNTER — Emergency Department (HOSPITAL_COMMUNITY): Payer: 59

## 2019-02-26 ENCOUNTER — Encounter (HOSPITAL_COMMUNITY): Payer: Self-pay

## 2019-02-26 DIAGNOSIS — R079 Chest pain, unspecified: Secondary | ICD-10-CM | POA: Insufficient documentation

## 2019-02-26 DIAGNOSIS — M79609 Pain in unspecified limb: Secondary | ICD-10-CM

## 2019-02-26 DIAGNOSIS — M79605 Pain in left leg: Secondary | ICD-10-CM | POA: Diagnosis not present

## 2019-02-26 LAB — CBC WITH DIFFERENTIAL/PLATELET
Abs Immature Granulocytes: 0.01 10*3/uL (ref 0.00–0.07)
Basophils Absolute: 0 10*3/uL (ref 0.0–0.1)
Basophils Relative: 0 %
Eosinophils Absolute: 0.1 10*3/uL (ref 0.0–0.5)
Eosinophils Relative: 2 %
HCT: 38.6 % (ref 36.0–46.0)
Hemoglobin: 12.3 g/dL (ref 12.0–15.0)
Immature Granulocytes: 0 %
Lymphocytes Relative: 46 %
Lymphs Abs: 2.3 10*3/uL (ref 0.7–4.0)
MCH: 29.3 pg (ref 26.0–34.0)
MCHC: 31.9 g/dL (ref 30.0–36.0)
MCV: 91.9 fL (ref 80.0–100.0)
Monocytes Absolute: 0.5 10*3/uL (ref 0.1–1.0)
Monocytes Relative: 9 %
Neutro Abs: 2.2 10*3/uL (ref 1.7–7.7)
Neutrophils Relative %: 43 %
Platelets: 227 10*3/uL (ref 150–400)
RBC: 4.2 MIL/uL (ref 3.87–5.11)
RDW: 13.2 % (ref 11.5–15.5)
WBC: 5.1 10*3/uL (ref 4.0–10.5)
nRBC: 0 % (ref 0.0–0.2)

## 2019-02-26 LAB — BASIC METABOLIC PANEL
Anion gap: 9 (ref 5–15)
BUN: 18 mg/dL (ref 6–20)
CO2: 25 mmol/L (ref 22–32)
Calcium: 9.3 mg/dL (ref 8.9–10.3)
Chloride: 105 mmol/L (ref 98–111)
Creatinine, Ser: 0.85 mg/dL (ref 0.44–1.00)
GFR calc Af Amer: >60 mL/min (ref >60)
GFR calc non Af Amer: >60 mL/min (ref >60)
Glucose, Bld: 87 mg/dL (ref 70–99)
Potassium: 3.3 mmol/L — ABNORMAL LOW (ref 3.5–5.1)
Sodium: 139 mmol/L (ref 135–145)

## 2019-02-26 MED ORDER — APIXABAN 2.5 MG PO TABS
2.5000 mg | ORAL_TABLET | Freq: Once | ORAL | Status: AC
  Administered 2019-02-26: 2.5 mg via ORAL
  Filled 2019-02-26: qty 1

## 2019-02-26 MED ORDER — IOHEXOL 350 MG/ML SOLN
100.0000 mL | Freq: Once | INTRAVENOUS | Status: AC | PRN
  Administered 2019-02-26: 100 mL via INTRAVENOUS

## 2019-02-26 MED ORDER — SODIUM CHLORIDE (PF) 0.9 % IJ SOLN
INTRAMUSCULAR | Status: AC
  Filled 2019-02-26: qty 50

## 2019-02-26 MED ORDER — APIXABAN 2.5 MG PO TABS: 2.5000 mg | ORAL_TABLET | Freq: Two times a day (BID) | ORAL | 0 refills | Status: DC

## 2019-02-26 NOTE — ED Provider Notes (Signed)
[facility]-EMERGENCY DEPT Provider Note   CSN: [id number] Arrival date & time: 02/26/19  0246    History   Chief Complaint Chief Complaint  Patient presents with  . Leg Pain    L    HPI Kelly Flowers is a 53 y.o. female.     HPI  This is a 53 year old female with a history of DVT who presents with left leg pain.  Patient reports that she has not taken her Eliquis since Sunday.  She ran out.  She has since developed left lateral leg pain in the calf to the thigh.  She describes it as tingly.  It is similar to when she was diagnosed with her DVT.  She does report intermittent chest pain but no active chest pain at the moment.  Denies shortness of breath.  Currently she rates her pain at 4 out of 10.  She states "I just wanted to get it checked out."  History reviewed. No pertinent past medical history.  Patient Active Problem List   Diagnosis Date Noted  . 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 FATIGUE 07/10/2008  . OBESITY 03/28/2008  . DEEP VENOUS THROMBOPHLEBITIS, LEFT, LEG, HX OF 03/28/2008    History reviewed. No pertinent surgical history.   OB History   No obstetric history on file.      Home Medications    Prior to Admission medications   Medication Sig Start Date End Date Taking? Authorizing Provider  cephALEXin (KEFLEX) 500 MG capsule Take 500 mg by mouth 2 (two) times daily.   Yes [provider]  Vitamin D, Ergocalciferol, (DRISDOL) 1.25 MG (50000 UT) CAPS capsule Take 50,000 Units by mouth every Wednesday.   Yes [provider]  apixaban (ELIQUIS) 2.5 MG TABS tablet Take 1 tablet (2.5 mg total) by mouth 2 (two) times daily. 02/26/19   Shon BatonHorton, Chenelle Benning F, MD  erythromycin ophthalmic ointment Place a 1/2 inch ribbon of ointment into the lower eyelid three times daily for 5 days Patient not taking: Reported on 02/26/2019 07/14/18   Charlynne PanderYao, David Hsienta, MD    Family History History  reviewed. No pertinent family history.  Social History Social History   Tobacco Use  . Smoking status: Never Smoker  . Smokeless tobacco: Never Used  Substance Use Topics  . Alcohol use: Never    Frequency: Never  . Drug use: Not on file     Allergies   Patient has no known allergies.   Review of Systems Review of Systems  Constitutional: Negative for fever.  Respiratory: Negative for shortness of breath.   Cardiovascular: Positive for chest pain. Negative for leg swelling.  Gastrointestinal: Negative for abdominal pain, nausea and vomiting.  Musculoskeletal:       Left leg pain  All other systems reviewed and are negative.    Physical Exam Updated Vital Signs BP 128/80 (BP Location: Right Arm)   Pulse 63   Temp 98.4 F (36.9 C) (Oral)   Resp 17   Ht 1.626 m (5\' 4" )   Wt 103.9 kg   SpO2 98%   BMI 39.31 kg/m   Physical Exam Vitals signs and nursing note reviewed.  Constitutional:      Appearance: She is well-developed. She is not ill-appearing.  HENT:     Head: Normocephalic and atraumatic.  Neck:     Musculoskeletal: Neck supple.  Cardiovascular:     Rate and Rhythm: Normal rate and regular rhythm.     Heart  sounds: Normal heart sounds.  Pulmonary:     Effort: Pulmonary effort is normal. No respiratory distress.     Breath sounds: No wheezing.  Abdominal:     General: Bowel sounds are normal.     Palpations: Abdomen is soft.  Musculoskeletal:        General: No deformity.     Right lower leg: No edema.     Left lower leg: No edema.     Comments: Left calf tenderness  Skin:    General: Skin is warm and dry.  Neurological:     Mental Status: She is alert and oriented to person, place, and time.  Psychiatric:        Mood and Affect: Mood normal.      ED Treatments / Results  Labs (all labs ordered are listed, but only abnormal results are displayed) Labs Reviewed  BASIC METABOLIC PANEL - Abnormal; Notable for the following components:       Result Value   Potassium 3.3 (*)    All other components within normal limits  CBC WITH DIFFERENTIAL/PLATELET    EKG None  Radiology Ct Angio Chest Pe W And/or Wo Contrast  Result Date: 02/26/2019 CLINICAL DATA:  Left leg pain. History of DVT. Right upper chest pain. EXAM: CT ANGIOGRAPHY CHEST WITH CONTRAST TECHNIQUE: Multidetector CT imaging of the chest was performed using the standard protocol during bolus administration of intravenous contrast. Multiplanar CT image reconstructions and MIPs were obtained to evaluate the vascular anatomy. CONTRAST:  OMNIPAQUE IOHEXOL 350 MG/ML SOLN COMPARISON:  01/28/2008 chest CT report-images not available FINDINGS: Cardiovascular: Satisfactory opacification of the pulmonary arteries to the segmental level. No evidence of pulmonary embolism. Normal heart size. No pericardial effusion. Mediastinum/Nodes: Negative for adenopathy or mass Lungs/Pleura: Borderline airway thickening. There is no edema, consolidation, effusion, or pneumothorax. Mild dependent atelectasis. Subpleural right lower lobe scarring attributed to adjacent osteophytes. Upper Abdomen: Postoperative stomach. Musculoskeletal: No acute finding. Spondylosis. Review of the MIP images confirms the above findings. IMPRESSION: Negative for pulmonary embolism or other acute finding. Electronically Signed   By: Marnee Spring M.D.   On: 02/26/2019 05:08    Procedures Procedures (including critical care time)  Medications Ordered in ED Medications  sodium chloride (PF) 0.9 % injection (has no administration in time range)  apixaban (ELIQUIS) tablet 2.5 mg (has no administration in time range)  iohexol (OMNIPAQUE) 350 MG/ML injection 100 mL (100 mLs Intravenous Contrast Given 02/26/19 0435)     Initial Impression / Assessment and Plan / ED Course  I have reviewed the triage vital signs and the nursing notes.  Pertinent labs & imaging results that were available during my care of the  patient were reviewed by me and considered in my medical decision making (see chart for details).        Patient presents with left leg pain.  Reports pain is consistent with prior DVTs.  She has been out of her Eliquis for 2 days.  She is overall nontoxic-appearing.  No respiratory distress.  She does report some intermittent chest discomfort but no significant shortness of breath.  She is not currently having any additional symptoms with the exception of left leg pain.  She is too high risk for d-dimer.  Will obtain basic lab work and obtain CT of the chest given intermittent chest discomfort.  CT PE protocol is negative.  Patient was given 1 dose of Eliquis.  Will return for ultrasound of the left lower extremity.  After history,  exam, and medical workup I feel the patient has been appropriately medically screened and is safe for discharge home. Pertinent diagnoses were discussed with the patient. Patient was given return precautions.   Final Clinical Impressions(s) / ED Diagnoses   Final diagnoses:  Left leg pain    ED Discharge Orders         Ordered    apixaban (ELIQUIS) 2.5 MG TABS tablet  2 times daily     02/26/19 0513    LE VENOUS     02/26/19 0513           Shon Baton, MD 02/26/19 [phone]

## 2019-02-26 NOTE — Discharge Instructions (Addendum)
You were seen today for leg pain.  You will be started back on her Eliquis.  Return to [facility] for imaging to rule out DVT of the left leg.

## 2019-02-26 NOTE — ED Triage Notes (Signed)
Pt reports L leg pain from her calf to her mid thigh. She has a history of DVT. She reports that she is on blood thinners and ran out 2 days ago. Denies SOB, chest pain, fevers, or cough.

## 2019-02-26 NOTE — Progress Notes (Signed)
Left lower extremity venous duplex completed. Preliminary results in Chart review CV Proc Toma Deiters, RVS 02/26/2019, 12:24 PM

## 2019-05-16 DIAGNOSIS — Z9049 Acquired absence of other specified parts of digestive tract: Secondary | ICD-10-CM | POA: Insufficient documentation

## 2019-09-21 ENCOUNTER — Emergency Department (HOSPITAL_COMMUNITY): Payer: 59

## 2019-09-21 ENCOUNTER — Emergency Department (HOSPITAL_COMMUNITY)
Admission: EM | Admit: 2019-09-21 | Discharge: 2019-09-21 | Disposition: A | Discharge status: Home / Self Care | Payer: 59 | Attending: Emergency Medicine | Admitting: Emergency Medicine

## 2019-09-21 ENCOUNTER — Other Ambulatory Visit: Payer: Self-pay

## 2019-09-21 ENCOUNTER — Encounter (HOSPITAL_COMMUNITY): Payer: Self-pay

## 2019-09-21 DIAGNOSIS — Y93I9 Activity, other involving external motion: Secondary | ICD-10-CM | POA: Diagnosis not present

## 2019-09-21 DIAGNOSIS — I82409 Acute embolism and thrombosis of unspecified deep veins of unspecified lower extremity: Secondary | ICD-10-CM | POA: Insufficient documentation

## 2019-09-21 DIAGNOSIS — S0990XA Unspecified injury of head, initial encounter: Secondary | ICD-10-CM | POA: Diagnosis present

## 2019-09-21 DIAGNOSIS — S0083XA Contusion of other part of head, initial encounter: Secondary | ICD-10-CM | POA: Insufficient documentation

## 2019-09-21 DIAGNOSIS — R519 Headache, unspecified: Secondary | ICD-10-CM | POA: Diagnosis not present

## 2019-09-21 DIAGNOSIS — S161XXA Strain of muscle, fascia and tendon at neck level, initial encounter: Secondary | ICD-10-CM | POA: Diagnosis not present

## 2019-09-21 DIAGNOSIS — Y92414 Local residential or business street as the place of occurrence of the external cause: Secondary | ICD-10-CM | POA: Diagnosis not present

## 2019-09-21 DIAGNOSIS — Z7901 Long term (current) use of anticoagulants: Secondary | ICD-10-CM | POA: Diagnosis not present

## 2019-09-21 DIAGNOSIS — Y999 Unspecified external cause status: Secondary | ICD-10-CM | POA: Insufficient documentation

## 2019-09-21 MED ORDER — CYCLOBENZAPRINE HCL 10 MG PO TABS
10.0000 mg | ORAL_TABLET | Freq: Once | ORAL | Status: AC
  Administered 2019-09-21: 10 mg via ORAL
  Filled 2019-09-21: qty 1

## 2019-09-21 MED ORDER — DICLOFENAC SODIUM 1 % EX GEL: 4.0000 g | Freq: Four times a day (QID) | CUTANEOUS | 1 refills | Status: DC

## 2019-09-21 MED ORDER — CYCLOBENZAPRINE HCL 10 MG PO TABS: 10.0000 mg | ORAL_TABLET | Freq: Two times a day (BID) | ORAL | 0 refills | Status: DC | PRN

## 2019-09-21 NOTE — Discharge Instructions (Signed)
Use the muscle relaxer as needed for tightness and spasm the pain medication you already have at home.  Also you can use the gel to help with the pain in your neck and shoulders.
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
## 2019-09-21 NOTE — ED Notes (Signed)
An After Visit Summary was printed and given to the patient. Discharge instructions given and no further questions at this time, pt states her daughter is taking her home.
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
## 2020-01-06 ENCOUNTER — Emergency Department
Admission: EM | Admit: 2020-01-06 | Discharge: 2020-01-06 | Disposition: A | Discharge status: Home / Self Care | Payer: Worker's Compensation | Attending: Emergency Medicine | Admitting: Emergency Medicine

## 2020-01-06 ENCOUNTER — Other Ambulatory Visit: Payer: Self-pay

## 2020-01-06 ENCOUNTER — Emergency Department: Payer: Worker's Compensation

## 2020-01-06 DIAGNOSIS — Y929 Unspecified place or not applicable: Secondary | ICD-10-CM | POA: Diagnosis not present

## 2020-01-06 DIAGNOSIS — Y990 Civilian activity done for income or pay: Secondary | ICD-10-CM | POA: Insufficient documentation

## 2020-01-06 DIAGNOSIS — S0990XA Unspecified injury of head, initial encounter: Secondary | ICD-10-CM

## 2020-01-06 DIAGNOSIS — R110 Nausea: Secondary | ICD-10-CM | POA: Diagnosis not present

## 2020-01-06 DIAGNOSIS — S0181XA Laceration without foreign body of other part of head, initial encounter: Secondary | ICD-10-CM | POA: Insufficient documentation

## 2020-01-06 DIAGNOSIS — W228XXA Striking against or struck by other objects, initial encounter: Secondary | ICD-10-CM | POA: Insufficient documentation

## 2020-01-06 DIAGNOSIS — R42 Dizziness and giddiness: Secondary | ICD-10-CM | POA: Diagnosis not present

## 2020-01-06 DIAGNOSIS — Y9389 Activity, other specified: Secondary | ICD-10-CM | POA: Insufficient documentation

## 2020-01-06 DIAGNOSIS — R519 Headache, unspecified: Secondary | ICD-10-CM | POA: Diagnosis not present

## 2020-01-06 NOTE — ED Provider Notes (Signed)
[facility] Emergency Department Provider Note ____________________________________________  Time seen: Approximately 3:24 PM  I have reviewed the triage vital signs and the nursing notes.   HISTORY  Chief Complaint Headache   HPI Kelly Flowers is a 54 y.o. female who presents to the emergency department for treatment and evaluation of headache and laceration of forehead after walking into a metal bar while at work today.  She states that she was unloading cars from the trailer and had started to walk around to move to the next level and did not see the metal bar.  She walked into it and it knocked her onto the ground.  She denies loss of consciousness.  She did feel dazed.  She is continues to have a headache and some nausea with some dizziness as well.  [facility] and was advised to come to the emergency department for laceration repair and CT of her head.   Location: Forehead Similar to previous headaches: No Duration: Prior to arrival TIMING: While at work SEVERITY: 6/10 QUALITY: Throbbing ache CONTEXT: Post injury MODIFYING FACTORS: None ASSOCIATED SYMPTOMS: Dizziness and nausea History reviewed. No pertinent past medical history.  Patient Active Problem List   Diagnosis Date Noted  . 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 FATIGUE 07/10/2008  . OBESITY 03/28/2008  . DEEP VENOUS THROMBOPHLEBITIS, LEFT, LEG, HX OF 03/28/2008    History reviewed. No pertinent surgical history.  Prior to Admission medications   Medication Sig Start Date End Date Taking? Authorizing Provider  apixaban (ELIQUIS) 2.5 MG TABS tablet Take 1 tablet (2.5 mg total) by mouth 2 (two) times daily. 02/26/19   Horton, Mayer Masker, MD  cyclobenzaprine (FLEXERIL) 10 MG tablet Take 1 tablet (10 mg total) by mouth 2 (two) times daily as needed for muscle spasms. 09/21/19   Gwyneth Sprout, MD  diclofenac Sodium (VOLTAREN) 1 %  GEL Apply 4 g topically 4 (four) times daily. 09/21/19   Gwyneth Sprout, MD  Vitamin D, Ergocalciferol, (DRISDOL) 1.25 MG (50000 UT) CAPS capsule Take 50,000 Units by mouth every Wednesday.    [provider]    Allergies Patient has no known allergies.  No family history on file.  Social History Social History   Tobacco Use  . Smoking status: Never Smoker  . Smokeless tobacco: Never Used  Substance Use Topics  . Alcohol use: Never  . Drug use: Not on file    Review of Systems Constitutional: No fever/chills.  Positive for recent injury. Eyes: No visual changes. ENT: No sore throat. Respiratory: Denies shortness of breath. Gastrointestinal: No abdominal pain.  Positive for nausea, no vomiting.  No diarrhea.  No constipation. Musculoskeletal: Negative for pain. Skin: Positive for laceration. Neurological:Positive for headache, negative for focal weakness or numbness. No confusion or fainting. ___________________________________________   PHYSICAL EXAM:  VITAL SIGNS: ED Triage Vitals  Enc Vitals Group     BP 01/06/20 1445 (!) 174/97     Pulse Rate 01/06/20 1445 (!) 58     Resp --      Temp 01/06/20 1445 98.4 F (36.9 C)     Temp src --      SpO2 --      Weight 01/06/20 1433 177 lb (80.3 kg)     Height 01/06/20 1433 5\' 4"  (1.626 m)     Head Circumference --      Peak Flow --      Pain Score 01/06/20 1433 3  Pain Loc --      Pain Edu? --      Excl. in [place]? --     Constitutional: Alert and oriented. Well appearing and in no acute distress. Eyes: Conjunctivae are normal. PERRL. EOMI without expressed pain. No evidence of papilledema on limited exam. Head: Forehead laceration Nose: No congestion/rhinnorhea. Mouth/Throat: Mucous membranes are moist.  Oropharynx non-erythematous. Neck: No stridor. Supple, no meningismus.  Cardiovascular: Normal rate, regular rhythm. Grossly normal heart sounds.  Good peripheral circulation. Respiratory: Normal  respiratory effort.  No retractions. Lungs CTAB. Gastrointestinal: Soft and nontender. No distention.  Musculoskeletal: No lower extremity tenderness nor edema.  No joint effusions. Neurologic:  Normal speech and language. No gross focal neurologic deficits are appreciated. No gait instability. Cranial nerves: 2-10 normal as tested. Cerebellar:Normal Romberg, finger-nose-finger, heel to shin, normal gait. Sensorimotor: No aphasia, pronator drift, clonus, sensory loss or abnormal reflexes.  Skin:  Skin is warm, dry and intact. No rash noted. Psychiatric: Mood and affect are normal. Speech and behavior are normal. Normal thought process and cognition.  ____________________________________________   LABS (all labs ordered are listed, but only abnormal results are displayed)  Labs Reviewed - No data to display ____________________________________________  EKG  Not indicated. ____________________________________________  RADIOLOGY  CT Head Wo Contrast  Result Date: 01/06/2020 CLINICAL DATA:  Headache EXAM: CT HEAD WITHOUT CONTRAST TECHNIQUE: Contiguous axial images were obtained from the base of the skull through the vertex without intravenous contrast. COMPARISON:  09/21/2019 FINDINGS: Brain: No acute intracranial abnormality. Specifically, no hemorrhage, hydrocephalus, mass lesion, acute infarction, or significant intracranial injury. Physiologic calcifications in the basal ganglia. Vascular: No hyperdense vessel or unexpected calcification. Skull: No acute calvarial abnormality. Sinuses/Orbits: Visualized paranasal sinuses and mastoids clear. Orbital soft tissues unremarkable. Other: None IMPRESSION: No acute intracranial abnormality. Electronically Signed   By: Rolm Baptise M.D.   On: 01/06/2020 15:43   ____________________________________________   PROCEDURES  Procedure(s) performed:  Marland KitchenMarland KitchenLaceration Repair  Date/Time: 01/06/2020 11:45 PM Performed by: Victorino Dike, FNP Authorized  by: Victorino Dike, FNP   Consent:    Consent obtained:  Verbal   Consent given by:  Patient   Risks discussed:  Poor cosmetic result and poor wound healing Anesthesia (see MAR for exact dosages):    Anesthesia method:  None Laceration details:    Location: Forehead. Repair type:    Repair type:  Simple Treatment:    Area cleansed with:  Hibiclens and saline Skin repair:    Repair method:  Tissue adhesive Approximation:    Approximation:  Close Post-procedure details:    Dressing:  Open (no dressing)    Critical Care performed: None ____________________________________________   INITIAL IMPRESSION / ASSESSMENT AND PLAN / ED COURSE  54 year old female presenting to the emergency department for treatment and evaluation after injury.  See HPI for further details.  CT of the head is negative for acute findings.  Results were discussed with the patient.  Wound was repaired as described above.  Patient will be discharged home with head injury instructions, wound care instructions and information about headaches.  She is to follow-up with primary care or return to the emergency department for concerns.  Pertinent labs & imaging results that were available during my care of the patient were reviewed by me and considered in my medical decision making (see chart for details). ____________________________________________   FINAL CLINICAL IMPRESSION(S) / ED DIAGNOSES  Final diagnoses:  Minor head injury, initial encounter  Forehead laceration, initial encounter  ED Discharge Orders    None        Chinita Pester, FNP 01/06/20 2346    Phineas Semen, MD 01/06/20 2350

## 2020-01-06 NOTE — ED Triage Notes (Signed)
Pt states she walked into a metal rod and fell back. Denies LOC, has a small lac to the forehead with controlled bleeding. C/o HA and dizziness. Pt is ambulatory with a steady gait pt si a/ox4.

## 2020-01-06 NOTE — Discharge Instructions (Signed)
Please follow up with primary care or return to the ER for symptoms of concern. 

## 2020-01-06 NOTE — ED Notes (Signed)
See triage note Presents with injury from work   States she walked into a metal rod  Fell backward  No LOC  Small laceration noted to forehead

## 2020-01-06 NOTE — ED Notes (Signed)
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

## 2020-01-28 ENCOUNTER — Encounter (HOSPITAL_COMMUNITY): Payer: Self-pay

## 2020-01-28 ENCOUNTER — Other Ambulatory Visit: Payer: Self-pay

## 2020-01-28 ENCOUNTER — Emergency Department (HOSPITAL_BASED_OUTPATIENT_CLINIC_OR_DEPARTMENT_OTHER): Payer: 59

## 2020-01-28 ENCOUNTER — Emergency Department (HOSPITAL_COMMUNITY)
Admission: EM | Admit: 2020-01-28 | Discharge: 2020-01-28 | Disposition: A | Discharge status: Home / Self Care | Payer: 59 | Attending: Emergency Medicine | Admitting: Emergency Medicine

## 2020-01-28 DIAGNOSIS — Z91010 Allergy to peanuts: Secondary | ICD-10-CM | POA: Diagnosis not present

## 2020-01-28 DIAGNOSIS — R2242 Localized swelling, mass and lump, left lower limb: Secondary | ICD-10-CM | POA: Insufficient documentation

## 2020-01-28 DIAGNOSIS — M545 Low back pain, unspecified: Secondary | ICD-10-CM

## 2020-01-28 DIAGNOSIS — M79609 Pain in unspecified limb: Secondary | ICD-10-CM

## 2020-01-28 DIAGNOSIS — Z7901 Long term (current) use of anticoagulants: Secondary | ICD-10-CM | POA: Insufficient documentation

## 2020-01-28 DIAGNOSIS — M79605 Pain in left leg: Secondary | ICD-10-CM | POA: Diagnosis not present

## 2020-01-28 MED ORDER — NAPROXEN 500 MG PO TABS: 500.0000 mg | ORAL_TABLET | Freq: Two times a day (BID) | ORAL | 0 refills | Status: DC

## 2020-01-28 MED ORDER — OXYCODONE-ACETAMINOPHEN 5-325 MG PO TABS
1.0000 | ORAL_TABLET | Freq: Once | ORAL | Status: AC
  Administered 2020-01-28: 1 via ORAL
  Filled 2020-01-28: qty 1

## 2020-01-28 MED ORDER — METHOCARBAMOL 500 MG PO TABS
500.0000 mg | ORAL_TABLET | Freq: Three times a day (TID) | ORAL | 0 refills | Status: DC | PRN
Start: 2020-01-28 — End: 2022-12-01

## 2020-01-28 NOTE — Discharge Instructions (Signed)
You were seen in the emergency department for left lower extremity and back pain today.  Your ultrasound did not show findings of a blood clot.  We have prescribed you an anti-inflammatory medication and a muscle relaxer.  - Naproxen is a nonsteroidal anti-inflammatory medication that will help with pain and swelling. Be sure to take this medication as prescribed with food, 1 pill every 12 hours,  It should be taken with food, as it can cause stomach upset, and more seriously, stomach bleeding. Do not take other nonsteroidal anti-inflammatory medications with this such as Advil, Motrin, Aleve, Mobic, Goodie Powder, or Motrin.    - Robaxin is the muscle relaxer I have prescribed, this is meant to help with muscle tightness. Be aware that this medication may make you drowsy therefore the first time you take this it should be at a time you are in an environment where you can rest. Do not drive or operate heavy machinery when taking this medication. Do not drink alcohol or take other sedating medications with this medicine such as narcotics or benzodiazepines.   You make take Tylenol per over the counter dosing with these medications.   We have prescribed you new medication(s) today. Discuss the medications prescribed today with your pharmacist as they can have adverse effects and interactions with your other medicines including over the counter and prescribed medications. Seek medical evaluation if you start to experience new or abnormal symptoms after taking one of these medicines, seek care immediately if you start to experience difficulty breathing, feeling of your throat closing, facial swelling, or rash as these could be indications of a more serious allergic reaction  Please follow-up with your primary care provider and/or orthopedics within 3 days for reevaluation.  However return to the ER should you develop ne or worsening symptoms or any other concerns including but not limited to severe or  worsening pain, leg swelling, discoloration to the leg, low back pain with fever, numbness, weakness, loss of bowel or bladder control, or inability to walk or urinate, you should return to the ER immediately.

## 2020-01-28 NOTE — ED Provider Notes (Signed)
[facility]-EMERGENCY DEPT Provider Note   CSN: [id number] Arrival date & time: 01/28/20  0041     History Chief Complaint  Patient presents with  . Leg Swelling    Kelly Flowers is a 54 y.o. female with a history of prior LLE DVT who presents to the ED with complaints of LLE pain that began last night. Patient states pain started in the L foot and has started to radiate up the entire LLE, she is also having pain to the left lower back. Discomfort is constant, worse with movement and certain positions, no alleviating factors. Reports associated swelling to the left foot/lower leg. Has had some intermittent tingling to the L foot. Denies fever, chills, redness, numbness, weakness, chest pain dyspnea, hemoptysis, recent travel/surgery/trauma/hospitalization, or hx of cancer. Hx of LLE DVT s/p cholecystectomy in July 2020, stopped Eliquis 1 month prior, she is unsure if this feels similar. Denies traumatic injury or change in activity. No insect bite.   HPI     History reviewed. No pertinent past medical history.  Patient Active Problem List   Diagnosis Date Noted  . 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 FATIGUE 07/10/2008  . OBESITY 03/28/2008  . DEEP VENOUS THROMBOPHLEBITIS, LEFT, LEG, HX OF 03/28/2008    History reviewed. No pertinent surgical history.   OB History   No obstetric history on file.     No family history on file.  Social History   Tobacco Use  . Smoking status: Never Smoker  . Smokeless tobacco: Never Used  Substance Use Topics  . Alcohol use: Never  . Drug use: Not on file    Home Medications Prior to Admission medications   Medication Sig Start Date End Date Taking? Authorizing Provider  apixaban (ELIQUIS) 2.5 MG TABS tablet Take 1 tablet (2.5 mg total) by mouth 2 (two) times daily. 02/26/19   Horton, Mayer Masker, MD  cyclobenzaprine (FLEXERIL) 10 MG tablet Take 1 tablet (10 mg total) by mouth 2  (two) times daily as needed for muscle spasms. 09/21/19   Gwyneth Sprout, MD  diclofenac Sodium (VOLTAREN) 1 % GEL Apply 4 g topically 4 (four) times daily. 09/21/19   Gwyneth Sprout, MD  Vitamin D, Ergocalciferol, (DRISDOL) 1.25 MG (50000 UT) CAPS capsule Take 50,000 Units by mouth every Wednesday.    [provider]    Allergies    Peanut-containing drug products  Review of Systems   Review of Systems  Constitutional: Negative for chills and fever.  Respiratory: Negative for cough and shortness of breath.   Cardiovascular: Positive for leg swelling. Negative for chest pain.  Gastrointestinal: Negative for abdominal pain, nausea and vomiting.  Musculoskeletal: Positive for back pain and myalgias.  Skin: Negative for color change and wound.  Neurological: Negative for weakness and numbness.       Positive for intermittent L foot paresthesias. Negative for incontinence or saddle anesthesia.   All other systems reviewed and are negative.   Physical Exam Updated Vital Signs BP (!) 124/100 (BP Location: Left Arm)   Pulse 65   Temp 97.9 F (36.6 C) (Oral)   Resp 16   SpO2 100%   Physical Exam Vitals and nursing note reviewed.  Constitutional:      General: She is not in acute distress.    Appearance: She is well-developed. She is not ill-appearing or toxic-appearing.  HENT:     Head: Normocephalic and atraumatic.  Eyes:     General:  Right eye: No discharge.        Left eye: No discharge.     Conjunctiva/sclera: Conjunctivae normal.  Cardiovascular:     Rate and Rhythm: Normal rate and regular rhythm.     Pulses:          Dorsalis pedis pulses are 2+ on the right side and 2+ on the left side.       Posterior tibial pulses are 2+ on the right side and 2+ on the left side.  Pulmonary:     Effort: Pulmonary effort is normal. No respiratory distress.     Breath sounds: Normal breath sounds. No wheezing, rhonchi or rales.  Abdominal:     General: There is  no distension.     Palpations: Abdomen is soft.     Tenderness: There is no abdominal tenderness.  Musculoskeletal:     Cervical back: Neck supple.     Comments: Lower extremities: No significant swelling/edema noted. No significant erythema/warmth. Symmetric temperatures. Intact AROM throughout. Tender to palpation diffusely throughout the foot & lower/upper leg of the LLE. Most tenderness throughout the mid/forefoot. Compartments are soft. NVI distally.  Back: No midline tenderness. Tenderness over the left lumbar paraspinal muscles extending to left gluteal region.   Skin:    General: Skin is warm and dry.     Capillary Refill: Capillary refill takes less than 2 seconds.     Findings: No rash.  Neurological:     Mental Status: She is alert.     Comments: Alert. Clear speech. Sensation grossly intact to bilateral lower extremities. 5/5 strength with plantar/dorsiflexion bilaterally.  Psychiatric:        Mood and Affect: Mood normal.        Behavior: Behavior normal.       ED Results / Procedures / Treatments   Labs (all labs ordered are listed, but only abnormal results are displayed) Labs Reviewed - No data to display  EKG None  Radiology VAS [place] LOWER EXTREMITY VENOUS (DVT) ([facility] and [facility] 7a-7p)  Result Date: 01/28/2020  Lower Venous DVTStudy Indications: Pain.  Risk Factors: None identified. Limitations: Body habitus and poor ultrasound/tissue interface. Comparison Study: No prior studies. Performing Technologist: Chanda Busing RVT  Examination Guidelines: A complete evaluation includes B-mode imaging, spectral Doppler, color Doppler, and power Doppler as needed of all accessible portions of each vessel. Bilateral testing is considered an integral part of a complete examination. Limited examinations for reoccurring indications may be performed as noted. The reflux portion of the exam is performed with the patient in reverse Trendelenburg.   +-----+---------------+---------+-----------+----------+--------------+ RIGHTCompressibilityPhasicitySpontaneityPropertiesThrombus Aging +-----+---------------+---------+-----------+----------+--------------+ CFV  Full           Yes      Yes                                 +-----+---------------+---------+-----------+----------+--------------+   +---------+---------------+---------+-----------+----------+--------------+ LEFT     CompressibilityPhasicitySpontaneityPropertiesThrombus Aging +---------+---------------+---------+-----------+----------+--------------+ CFV      Full           Yes      Yes                                 +---------+---------------+---------+-----------+----------+--------------+ SFJ      Full                                                        +---------+---------------+---------+-----------+----------+--------------+  FV Prox  Full                                                        +---------+---------------+---------+-----------+----------+--------------+ FV Mid   Full                                                        +---------+---------------+---------+-----------+----------+--------------+ FV DistalFull                                                        +---------+---------------+---------+-----------+----------+--------------+ PFV      Full                                                        +---------+---------------+---------+-----------+----------+--------------+ POP      Full           Yes      Yes                                 +---------+---------------+---------+-----------+----------+--------------+ PTV      Full                                                        +---------+---------------+---------+-----------+----------+--------------+ PERO     Full                                                         +---------+---------------+---------+-----------+----------+--------------+     Summary: RIGHT: - No evidence of common femoral vein obstruction.  LEFT: - There is no evidence of deep vein thrombosis in the lower extremity.  - No cystic structure found in the popliteal fossa.  *See table(s) above for measurements and observations.    Preliminary     Procedures Procedures (including critical care time)  Medications Ordered in ED Medications - No data to display  ED Course  I have reviewed the triage vital signs and the nursing notes.  Pertinent labs & imaging results that were available during my care of the patient were reviewed by me and considered in my medical decision making (see chart for details).    MDM Rules/Calculators/A&P                      Patient presents to the ED with complaints of LLE & back pain. Patient is nontoxic, resting comfortably, vitals without significant abnormality.  On exam patient is afebrile, I do not appreciate any  erythema or increased warmth, no skin lesions, not an IV drug user, low suspicion for infectious process such as cellulitis, abscess, septic joint, or epidural abscess. No neuro deficits, ambulatory, do not suspect cord compression or cauda equina.  Given her history of prior DVT recently stopping Eliquis obtained venous duplex ultrasound, this was negative for DVT.  Symmetric temperatures to the bilateral lower extremities, 2+ symmetric pulses, do not suspect arterial emboli.  No direct trauma, does not have specific point/focal bony tenderness, feel fracture/dislocation is less likely.  Unclear definitive etiology, likely musculoskeletal in nature. Will trial naproxen and robaxin at this time. I discussed results, treatment plan, need for follow-up, and return precautions with the patient. Provided opportunity for questions, patient confirmed understanding and is in agreement with plan.    Final Clinical Impression(s) / ED Diagnoses Final diagnoses:   Pain of left lower extremity  Acute left-sided low back pain, unspecified whether sciatica present    Rx / DC Orders ED Discharge Orders         Ordered    naproxen (NAPROSYN) 500 MG tablet  2 times daily     01/28/20 0951    methocarbamol (ROBAXIN) 500 MG tablet  Every 8 hours PRN     01/28/20 0951           Cherly Anderson, PA-C 01/28/20 0953    Lorre Nick, MD 01/29/20 1546

## 2020-01-28 NOTE — ED Triage Notes (Signed)
Patient arrived stating yesterday her left leg started to hurt, today it began swelling. Declines any injury. Patient reports having surgery in July and stopped taking a blood thinner a month ago. Foot warm to touch.

## 2020-01-28 NOTE — Progress Notes (Signed)
Left lower extremity venous duplex has been completed. Preliminary results can be found in CV Proc through chart review.  Results were given to [facility] PA.  01/28/20 8:37 AM Olen Cordial RVT
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
## 2021-02-09 DIAGNOSIS — Z20822 Contact with and (suspected) exposure to covid-19: Secondary | ICD-10-CM | POA: Diagnosis not present

## 2021-02-15 DIAGNOSIS — Z20822 Contact with and (suspected) exposure to covid-19: Secondary | ICD-10-CM | POA: Diagnosis not present

## 2021-02-23 DIAGNOSIS — Z20822 Contact with and (suspected) exposure to covid-19: Secondary | ICD-10-CM | POA: Diagnosis not present

## 2021-03-06 DIAGNOSIS — Z20822 Contact with and (suspected) exposure to covid-19: Secondary | ICD-10-CM | POA: Diagnosis not present

## 2021-03-13 DIAGNOSIS — Z20822 Contact with and (suspected) exposure to covid-19: Secondary | ICD-10-CM | POA: Diagnosis not present

## 2021-03-20 DIAGNOSIS — Z20822 Contact with and (suspected) exposure to covid-19: Secondary | ICD-10-CM | POA: Diagnosis not present

## 2021-03-24 DIAGNOSIS — Z20822 Contact with and (suspected) exposure to covid-19: Secondary | ICD-10-CM | POA: Diagnosis not present

## 2021-04-02 DIAGNOSIS — Z20822 Contact with and (suspected) exposure to covid-19: Secondary | ICD-10-CM | POA: Diagnosis not present

## 2021-04-08 DIAGNOSIS — Z20822 Contact with and (suspected) exposure to covid-19: Secondary | ICD-10-CM | POA: Diagnosis not present

## 2021-04-17 DIAGNOSIS — Z20822 Contact with and (suspected) exposure to covid-19: Secondary | ICD-10-CM | POA: Diagnosis not present

## 2021-04-21 DIAGNOSIS — Z20822 Contact with and (suspected) exposure to covid-19: Secondary | ICD-10-CM | POA: Diagnosis not present

## 2021-04-24 DIAGNOSIS — Z20822 Contact with and (suspected) exposure to covid-19: Secondary | ICD-10-CM | POA: Diagnosis not present

## 2021-05-11 DIAGNOSIS — Z20822 Contact with and (suspected) exposure to covid-19: Secondary | ICD-10-CM | POA: Diagnosis not present

## 2021-10-14 ENCOUNTER — Other Ambulatory Visit: Payer: Self-pay

## 2021-10-14 ENCOUNTER — Ambulatory Visit
Admission: EM | Admit: 2021-10-14 | Discharge: 2021-10-14 | Disposition: A | Discharge status: Home / Self Care | Payer: 59 | Attending: Physician Assistant | Admitting: Physician Assistant

## 2021-10-14 ENCOUNTER — Encounter: Payer: Self-pay | Admitting: Emergency Medicine

## 2021-10-14 DIAGNOSIS — J069 Acute upper respiratory infection, unspecified: Secondary | ICD-10-CM

## 2021-10-14 DIAGNOSIS — Z20822 Contact with and (suspected) exposure to covid-19: Secondary | ICD-10-CM | POA: Diagnosis not present

## 2021-10-14 LAB — POCT INFLUENZA A/B
Influenza A, POC: NEGATIVE
Influenza B, POC: NEGATIVE

## 2021-10-14 NOTE — ED Provider Notes (Signed)
[facility]    CSN: [id number] Arrival date & time: 10/14/21  1158      History   Chief Complaint Chief Complaint  Patient presents with   Diarrhea   Generalized Body Aches    HPI Kelly Flowers is a 56 y.o. female.   Patient here today for evaluation of chills, body aches, nausea, diarrhea, headache and congestion that started yesterday.  She has had mild sore throat.  She has not taken any medication for symptoms.  The history is provided by the patient.  Diarrhea Associated symptoms: chills and myalgias   Associated symptoms: no abdominal pain, no fever and no vomiting    History reviewed. No pertinent past medical history.  Patient Active Problem List   Diagnosis Date Noted   DVT of lower extremity (deep venous thrombosis) (HCC) 10/16/2010   FATIGUE 07/10/2008   OBESITY 03/28/2008   DEEP VENOUS THROMBOPHLEBITIS, LEFT, LEG, HX OF 03/28/2008    History reviewed. No pertinent surgical history.  OB History   No obstetric history on file.      Home Medications    Prior to Admission medications   Medication Sig Start Date End Date Taking? Authorizing Provider  methocarbamol (ROBAXIN) 500 MG tablet Take 1 tablet (500 mg total) by mouth every 8 (eight) hours as needed for muscle spasms. 01/28/20   Petrucelli, Samantha R, PA-C  naproxen (NAPROSYN) 500 MG tablet Take 1 tablet (500 mg total) by mouth 2 (two) times daily. 01/28/20   Petrucelli, Samantha R, PA-C  Vitamin D, Ergocalciferol, (DRISDOL) 1.25 MG (50000 UT) CAPS capsule Take 50,000 Units by mouth every Wednesday.    [provider]  apixaban (ELIQUIS) 2.5 MG TABS tablet Take 1 tablet (2.5 mg total) by mouth 2 (two) times daily. Patient not taking: Reported on 01/28/2020 02/26/19 01/28/20  Horton, Mayer Masker, MD    Family History History reviewed. No pertinent family history.  Social History Social History   Tobacco Use   Smoking status: Never   Smokeless tobacco: Never  Substance Use  Topics   Alcohol use: Never     Allergies   Peanut-containing drug products   Review of Systems Review of Systems  Constitutional:  Positive for chills. Negative for fever.  HENT:  Positive for congestion and sore throat. Negative for ear pain.   Eyes:  Negative for discharge and redness.  Respiratory:  Positive for cough. Negative for shortness of breath and wheezing.   Gastrointestinal:  Positive for diarrhea and nausea. Negative for abdominal pain and vomiting.  Musculoskeletal:  Positive for myalgias.    Physical Exam Triage Vital Signs ED Triage Vitals  Enc Vitals Group     BP 10/14/21 1309 118/78     Pulse Rate 10/14/21 1309 79     Resp 10/14/21 1309 16     Temp 10/14/21 1309 98.8 F (37.1 C)     Temp Source 10/14/21 1309 Oral     SpO2 10/14/21 1309 96 %     Weight --      Height --      Head Circumference --      Peak Flow --      Pain Score 10/14/21 1308 8     Pain Loc --      Pain Edu? --      Excl. in [place]? --    No data found.  Updated Vital Signs BP 118/78 (BP Location: Left Arm)    Pulse 79    Temp 98.8 F (37.1 C) (  Oral)    Resp 16    SpO2 96%      Physical Exam Vitals and nursing note reviewed.  Constitutional:      General: She is not in acute distress.    Appearance: Normal appearance. She is not ill-appearing.  HENT:     Head: Normocephalic and atraumatic.     Nose: Congestion present.     Mouth/Throat:     Mouth: Mucous membranes are moist.     Pharynx: No oropharyngeal exudate or posterior oropharyngeal erythema.  Eyes:     Conjunctiva/sclera: Conjunctivae normal.  Cardiovascular:     Rate and Rhythm: Normal rate and regular rhythm.     Heart sounds: Normal heart sounds. No murmur heard. Pulmonary:     Effort: Pulmonary effort is normal. No respiratory distress.     Breath sounds: Normal breath sounds. No wheezing, rhonchi or rales.  Skin:    General: Skin is warm and dry.  Neurological:     Mental Status: She is alert.   Psychiatric:        Mood and Affect: Mood normal.        Thought Content: Thought content normal.     UC Treatments / Results  Labs (all labs ordered are listed, but only abnormal results are displayed) Labs Reviewed  NOVEL CORONAVIRUS, NAA  POCT INFLUENZA A/B    EKG   Radiology No results found.  Procedures Procedures (including critical care time)  Medications Ordered in UC Medications - No data to display  Initial Impression / Assessment and Plan / UC Course  I have reviewed the triage vital signs and the nursing notes.  Pertinent labs & imaging results that were available during my care of the patient were reviewed by me and considered in my medical decision making (see chart for details).    Viral etiology of symptoms.  Flu test negative in office.  Will order COVID screening.  Recommended symptomatic treatment and follow-up with any further concerns.  Final Clinical Impressions(s) / UC Diagnoses   Final diagnoses:  Acute upper respiratory infection   Discharge Instructions   None    ED Prescriptions   None    PDMP not reviewed this encounter.   Tomi Bamberger, PA-C 10/14/21 1344

## 2021-10-14 NOTE — ED Triage Notes (Signed)
Chills, body aches, diarrhea, nausea, headache, nasal congestion starting yesterday

## 2021-10-15 LAB — NOVEL CORONAVIRUS, NAA: SARS-CoV-2, NAA: NOT DETECTED

## 2021-10-15 LAB — SARS-COV-2, NAA 2 DAY TAT

## 2022-07-26 ENCOUNTER — Other Ambulatory Visit: Payer: Self-pay | Admitting: *Deleted

## 2022-07-26 DIAGNOSIS — Z1231 Encounter for screening mammogram for malignant neoplasm of breast: Secondary | ICD-10-CM

## 2022-07-30 ENCOUNTER — Inpatient Hospital Stay: Admission: RE | Admit: 2022-07-30 | Payer: No Typology Code available for payment source | Source: Ambulatory Visit

## 2022-08-01 ENCOUNTER — Other Ambulatory Visit: Payer: Self-pay | Admitting: Nurse Practitioner

## 2022-08-01 DIAGNOSIS — Z1231 Encounter for screening mammogram for malignant neoplasm of breast: Secondary | ICD-10-CM

## 2022-10-10 ENCOUNTER — Telehealth: Payer: Self-pay

## 2022-10-10 NOTE — Telephone Encounter (Signed)
Mychat msg sent. AS, CMA 

## 2022-12-01 ENCOUNTER — Ambulatory Visit
Admission: EM | Admit: 2022-12-01 | Discharge: 2022-12-01 | Disposition: A | Discharge status: Home / Self Care | Payer: Medicaid Other | Attending: Internal Medicine | Admitting: Internal Medicine

## 2022-12-01 DIAGNOSIS — B349 Viral infection, unspecified: Secondary | ICD-10-CM

## 2022-12-01 DIAGNOSIS — R059 Cough, unspecified: Secondary | ICD-10-CM | POA: Diagnosis present

## 2022-12-01 DIAGNOSIS — R52 Pain, unspecified: Secondary | ICD-10-CM | POA: Diagnosis present

## 2022-12-01 DIAGNOSIS — Z1152 Encounter for screening for COVID-19: Secondary | ICD-10-CM | POA: Diagnosis not present

## 2022-12-01 MED ORDER — PREDNISONE 20 MG PO TABS: 40.0000 mg | ORAL_TABLET | Freq: Every day | ORAL | 0 refills | Status: AC

## 2022-12-01 NOTE — ED Triage Notes (Signed)
Patient with c/o cough, congestion and body aches for a few days. Patient states she has been coughing up some phlegm. Denies fever at home.

## 2022-12-01 NOTE — ED Provider Notes (Signed)
[facility]    CSN: [id number] Arrival date & time: 12/01/22  0912      History   Chief Complaint Chief Complaint  Patient presents with   Cough   Generalized Body Aches    HPI Kelly Flowers is a 57 y.o. female.   Patient presents with generalized bodyaches, cough, nasal congestion, ear pain that started about 4 days ago.  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  Denies any documented fever at home.  Denies chest pain, shortness of breath, sore throat, nausea, vomiting, diarrhea, abdominal pain.  Reports that she has been taking several over-the-counter cough and cold medications with no improvement in symptoms or bodyaches.  Denies history of asthma or COPD and patient does not smoke cigarettes.   Cough   History reviewed. No pertinent past medical history.  Patient Active Problem List   Diagnosis Date Noted   DVT of lower extremity (deep venous thrombosis) ([place]) 10/16/2010   FATIGUE 07/10/2008   OBESITY 03/28/2008   DEEP VENOUS THROMBOPHLEBITIS, LEFT, LEG, HX OF 03/28/2008    History reviewed. No pertinent surgical history.  OB History   No obstetric history on file.      Home Medications    Prior to Admission medications   Medication Sig Start Date End Date Taking? Authorizing Provider  predniSONE (DELTASONE) 20 MG tablet Take 2 tablets (40 mg total) by mouth daily for 5 days. 12/01/22 12/06/22 Yes Lawarence Meek, Michele Rockers, FNP  apixaban (ELIQUIS) 2.5 MG TABS tablet Take 1 tablet (2.5 mg total) by mouth 2 (two) times daily. Patient not taking: Reported on 01/28/2020 02/26/19 01/28/20  Horton, Barbette Hair, MD    Family History No family history on file.  Social History Social History   Tobacco Use   Smoking status: Never   Smokeless tobacco: Never  Substance Use Topics   Alcohol use: Never     Allergies   Peanut-containing drug products   Review of Systems Review of Systems Per  HPI  Physical Exam Triage Vital Signs ED Triage Vitals [12/01/22 1100]  Enc Vitals Group     BP 127/80     Pulse Rate 73     Resp 20     Temp 98.5 F (36.9 C)     Temp Source Oral     SpO2 97 %     Weight      Height      Head Circumference      Peak Flow      Pain Score 10     Pain Loc      Pain Edu?      Excl. in [place]?    No data found.  Updated Vital Signs BP 127/80 (BP Location: Left Arm)   Pulse 73   Temp 98.5 F (36.9 C) (Oral)   Resp 20   SpO2 97%   Visual Acuity Right Eye Distance:   Left Eye Distance:   Bilateral Distance:    Right Eye Near:   Left Eye Near:    Bilateral Near:     Physical Exam Constitutional:      General: She is not in acute distress.    Appearance: Normal appearance. She is not toxic-appearing or diaphoretic.  HENT:     Head: Normocephalic and atraumatic.     Right Ear: Ear canal normal. A middle ear effusion is present. Tympanic membrane is not perforated, erythematous or bulging.     Left Ear: Ear  canal normal. A middle ear effusion is present. Tympanic membrane is not perforated, erythematous or bulging.     Nose: Congestion present.     Mouth/Throat:     Mouth: Mucous membranes are moist.     Pharynx: No posterior oropharyngeal erythema.  Eyes:     Extraocular Movements: Extraocular movements intact.     Conjunctiva/sclera: Conjunctivae normal.     Pupils: Pupils are equal, round, and reactive to light.  Cardiovascular:     Rate and Rhythm: Normal rate and regular rhythm.     Pulses: Normal pulses.     Heart sounds: Normal heart sounds.  Pulmonary:     Effort: Pulmonary effort is normal. No respiratory distress.     Breath sounds: Normal breath sounds. No stridor. No wheezing, rhonchi or rales.  Abdominal:     General: Abdomen is flat. Bowel sounds are normal.     Palpations: Abdomen is soft.  Musculoskeletal:        General: Normal range of motion.     Cervical back: Normal range of motion.  Skin:    General: Skin  is warm and dry.  Neurological:     General: No focal deficit present.     Mental Status: She is alert and oriented to person, place, and time. Mental status is at baseline.  Psychiatric:        Mood and Affect: Mood normal.        Behavior: Behavior normal.      UC Treatments / Results  Labs (all labs ordered are listed, but only abnormal results are displayed) Labs Reviewed  SARS CORONAVIRUS 2 (TAT 6-24 HRS)    EKG   Radiology No results found.  Procedures Procedures (including critical care time)  Medications Ordered in UC Medications - No data to display  Initial Impression / Assessment and Plan / UC Course  I have reviewed the triage vital signs and the nursing notes.  Pertinent labs & imaging results that were available during my care of the patient were reviewed by me and considered in my medical decision making (see chart for details).     Suspect viral cause to patient's symptoms.  Flu testing was deferred given duration of symptoms as it would not change treatment.  COVID test is pending.  Advised supportive care and symptom management.  Patient has not had any relief from over-the-counter medications and I do think patient would benefit from steroid therapy given appearance on physical exam.  Patient does not take any daily medications and denies history of chronic medical problems so steroid therapy should be safe and there are no contraindications.  Advised to take this medication with food.  Discussed return precautions.  Patient verbalized understanding and was agreeable with plan. Final Clinical Impressions(s) / UC Diagnoses   Final diagnoses:  Viral illness     Discharge Instructions      It appears that you have a viral illness.  I have prescribed prednisone to help alleviate inflammation and associated symptoms.  COVID test is pending.  Follow-up if any symptoms persist or worsen.    ED Prescriptions     Medication Sig Dispense Auth. Provider    predniSONE (DELTASONE) 20 MG tablet Take 2 tablets (40 mg total) by mouth daily for 5 days. 10 tablet Teodora Medici, [place]      PDMP not reviewed this encounter.   Teodora Medici,  12/01/22 1122

## 2022-12-01 NOTE — Discharge Instructions (Signed)
It appears that you have a viral illness.  I have prescribed prednisone to help alleviate inflammation and associated symptoms.  COVID test is pending.  Follow-up if any symptoms persist or worsen.

## 2022-12-02 LAB — SARS CORONAVIRUS 2 (TAT 6-24 HRS): SARS Coronavirus 2: NEGATIVE

## 2022-12-03 ENCOUNTER — Ambulatory Visit
Admission: RE | Admit: 2022-12-03 | Discharge: 2022-12-03 | Disposition: A | Discharge status: Home / Self Care | Payer: Medicaid Other | Source: Ambulatory Visit | Attending: Emergency Medicine | Admitting: Emergency Medicine

## 2022-12-03 VITALS — BP 129/79 | HR 63 | Temp 98.0°F | Resp 20

## 2022-12-03 DIAGNOSIS — J069 Acute upper respiratory infection, unspecified: Secondary | ICD-10-CM

## 2022-12-03 MED ORDER — AZITHROMYCIN 250 MG PO TABS: 250.0000 mg | ORAL_TABLET | Freq: Every day | ORAL | 0 refills | Status: DC

## 2022-12-03 MED ORDER — PROMETHAZINE-DM 6.25-15 MG/5ML PO SYRP: 5.0000 mL | ORAL_SOLUTION | Freq: Four times a day (QID) | ORAL | 0 refills | Status: AC | PRN

## 2022-12-03 MED ORDER — BENZONATATE 100 MG PO CAPS: 100.0000 mg | ORAL_CAPSULE | Freq: Three times a day (TID) | ORAL | 0 refills | Status: AC

## 2022-12-03 MED ORDER — IPRATROPIUM BROMIDE 0.03 % NA SOLN: 2.0000 | Freq: Two times a day (BID) | NASAL | 12 refills | Status: AC

## 2022-12-03 NOTE — ED Provider Notes (Signed)
[facility]    CSN: [id number] Arrival date & time: 12/03/22  1350      History   Chief Complaint Chief Complaint  Patient presents with   Medication Reaction    Entered by patient    HPI Kelly Flowers is a 57 y.o. female.   Patient presents for evaluation of nasal congestion, rhinorrhea, bilateral ear pain, nonproductive cough and bodyaches present for 7 days.  Was evaluated in urgent care 3 to 4 days ago and prescribed prednisone for a viral illness, patient believes she is now having a reaction to the medication after taking 3 doses.  Has been experiencing facial itching without rash, decreased appetite and restlessness making it difficult to sleep.  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  Additionally has attempted use of TheraFlu which has been minimally effective.  Denies fevers, shortness of breath or wheezing.    History reviewed. No pertinent past medical history.  Patient Active Problem List   Diagnosis Date Noted   DVT of lower extremity (deep venous thrombosis) ([place]) 10/16/2010   FATIGUE 07/10/2008   OBESITY 03/28/2008   DEEP VENOUS THROMBOPHLEBITIS, LEFT, LEG, HX OF 03/28/2008    History reviewed. No pertinent surgical history.  OB History   No obstetric history on file.      Home Medications    Prior to Admission medications   Medication Sig Start Date End Date Taking? Authorizing Provider  predniSONE (DELTASONE) 20 MG tablet Take 2 tablets (40 mg total) by mouth daily for 5 days. 12/01/22 12/06/22  Teodora Medici, FNP  apixaban (ELIQUIS) 2.5 MG TABS tablet Take 1 tablet (2.5 mg total) by mouth 2 (two) times daily. Patient not taking: Reported on 01/28/2020 02/26/19 01/28/20  Horton, Barbette Hair, MD    Family History History reviewed. No pertinent family history.  Social History Social History   Tobacco Use   Smoking status: Never   Smokeless tobacco: Never  Substance Use Topics   Alcohol use: Never     Allergies    Peanut-containing drug products   Review of Systems Review of Systems  Constitutional: Negative.   HENT:  Positive for congestion, ear pain and rhinorrhea. Negative for dental problem, drooling, ear discharge, facial swelling, hearing loss, mouth sores, nosebleeds, postnasal drip, sinus pressure, sinus pain, sneezing, sore throat, tinnitus, trouble swallowing and voice change.   Respiratory:  Positive for cough. Negative for apnea, choking, chest tightness, shortness of breath, wheezing and stridor.   Cardiovascular: Negative.   Gastrointestinal: Negative.   Musculoskeletal:  Positive for myalgias. Negative for arthralgias, back pain, gait problem, joint swelling, neck pain and neck stiffness.  Skin: Negative.   Neurological: Negative.      Physical Exam Triage Vital Signs ED Triage Vitals  Enc Vitals Group     BP 12/03/22 1415 129/79     Pulse Rate 12/03/22 1415 63     Resp 12/03/22 1415 20     Temp 12/03/22 1415 98 F (36.7 C)     Temp Source 12/03/22 1415 Oral     SpO2 12/03/22 1415 98 %     Weight --      Height --      Head Circumference --      Peak Flow --      Pain Score 12/03/22 1417 0     Pain Loc --      Pain Edu? --      Excl. in [place]? --    No data found.  Updated Vital Signs BP 129/79 (BP Location: Left Arm)   Pulse 63   Temp 98 F (36.7 C) (Oral)   Resp 20   SpO2 98%   Visual Acuity Right Eye Distance:   Left Eye Distance:   Bilateral Distance:    Right Eye Near:   Left Eye Near:    Bilateral Near:     Physical Exam Constitutional:      Appearance: She is ill-appearing.  HENT:     Head: Normocephalic.     Right Ear: Tympanic membrane, ear canal and external ear normal. There is no impacted cerumen.     Left Ear: Tympanic membrane, ear canal and external ear normal.     Nose: Congestion and rhinorrhea present.     Mouth/Throat:     Mouth: Mucous membranes are moist.     Pharynx: Posterior oropharyngeal erythema present.  Eyes:      Extraocular Movements: Extraocular movements intact.  Cardiovascular:     Rate and Rhythm: Normal rate and regular rhythm.     Pulses: Normal pulses.     Heart sounds: Normal heart sounds.  Pulmonary:     Effort: Pulmonary effort is normal.     Breath sounds: Normal breath sounds.  Skin:    General: Skin is warm and dry.  Neurological:     Mental Status: She is alert and oriented to person, place, and time. Mental status is at baseline.      UC Treatments / Results  Labs (all labs ordered are listed, but only abnormal results are displayed) Labs Reviewed - No data to display  EKG   Radiology No results found.  Procedures Procedures (including critical care time)  Medications Ordered in UC Medications - No data to display  Initial Impression / Assessment and Plan / UC Course  I have reviewed the triage vital signs and the nursing notes.  Pertinent labs & imaging results that were available during my care of the patient were reviewed by me and considered in my medical decision making (see chart for details).  Acute upper respiratory infection  Patient is in no signs of distress nor toxic appearing.  Vital signs are stable.  Low suspicion for pneumonia, pneumothorax or bronchitis and therefore will defer imaging.  Advise discontinuation of prednisone due to intolerance, listed as an allergy.  Prescribed azithromycin prophylactically as well as Tessalon, Promethazine DM and Atrovent nasal spray for supportive care.May use additional over-the-counter medications as needed for supportive care.  May follow-up with urgent care as needed if symptoms persist or worsen.  Note given.    Final Clinical Impressions(s) / UC Diagnoses   Final diagnoses:  None   Discharge Instructions   None    ED Prescriptions   None    PDMP not reviewed this encounter.   Hans Eden, NP 12/04/22 1025

## 2022-12-03 NOTE — Discharge Instructions (Signed)
Prednisone has been added to your allergy list, do not take medicine again  Azithromycin to provide coverage for bacteria  You may use Tessalon pill every 8 hours to help calm your coughing  May use cough syrup every 6 hours as needed for additional relief, be mindful this may make you drowsy  You may use nasal spray every morning and every evening to help clear out the sinuses and reduce your congestion   You can take Tylenol and/or Ibuprofen as needed for fever reduction and pain relief.   For cough: honey 1/2 to 1 teaspoon (you can dilute the honey in water or another fluid).  You can also use guaifenesin and dextromethorphan for cough. You can use a humidifier for chest congestion and cough.  If you don't have a humidifier, you can sit in the bathroom with the hot shower running.      For sore throat: try warm salt water gargles, cepacol lozenges, throat spray, warm tea or water with lemon/honey, popsicles or ice, or OTC cold relief medicine for throat discomfort.   For congestion: take a daily anti-histamine like Zyrtec, Claritin, and a oral decongestant, such as pseudoephedrine.  You can also use Flonase 1-2 sprays in each nostril daily.   It is important to stay hydrated: drink plenty of fluids (water, gatorade/powerade/pedialyte, juices, or teas) to keep your throat moisturized and help further relieve irritation/discomfort.

## 2022-12-03 NOTE — ED Triage Notes (Signed)
Pt reports that she is experiencing loss of appetite, itching, and restlessness  due to the prednisone she was prescribed x 3 days.

## 2023-07-27 DIAGNOSIS — J00 Acute nasopharyngitis [common cold]: Secondary | ICD-10-CM | POA: Diagnosis not present

## 2023-08-03 ENCOUNTER — Ambulatory Visit
Admission: EM | Admit: 2023-08-03 | Discharge: 2023-08-03 | Disposition: A | Discharge status: Home / Self Care | Payer: Medicaid Other

## 2023-08-03 DIAGNOSIS — J40 Bronchitis, not specified as acute or chronic: Secondary | ICD-10-CM

## 2023-08-03 DIAGNOSIS — J329 Chronic sinusitis, unspecified: Secondary | ICD-10-CM

## 2023-08-03 MED ORDER — AZITHROMYCIN 250 MG PO TABS: 250.0000 mg | ORAL_TABLET | Freq: Every day | ORAL | 0 refills | Status: AC

## 2023-08-03 NOTE — ED Provider Notes (Signed)
[facility]    CSN: [id number] Arrival date & time: 08/03/23  0808      History   Chief Complaint Chief Complaint  Patient presents with   Cough   Shortness of Breath    HPI Kelly Flowers is a 57 y.o. female.   Patient here today for evaluation of cough, congestion, mild shortness of breath that started about a week ago.  She reports that she did take COVID and flu screening test that were negative.  She has not had any vomiting or diarrhea.  She has taken multiple over-the-counter medications without resolution.  The history is provided by the patient.    History reviewed. No pertinent past medical history.  Patient Active Problem List   Diagnosis Date Noted   S/P laparoscopic cholecystectomy 05/16/2019   DVT of lower extremity (deep venous thrombosis) ([facility]) 10/16/2010   FATIGUE 07/10/2008   Obesity 03/28/2008   DEEP VENOUS THROMBOPHLEBITIS, LEFT, LEG, HX OF 03/28/2008    History reviewed. No pertinent surgical history.  OB History   No obstetric history on file.      Home Medications    Prior to Admission medications   Medication Sig Start Date End Date Taking? Authorizing Provider  amitriptyline (ELAVIL) 10 MG tablet Take 10 mg by mouth at bedtime as needed for sleep. 04/14/20  Yes [provider]  apixaban (ELIQUIS) 2.5 MG TABS tablet Take 2.5 mg by mouth daily. 04/12/19  Yes [provider]  cyclobenzaprine (FLEXERIL) 10 MG tablet Take 10 mg by mouth 3 (three) times daily as needed for muscle spasms. 09/21/19  Yes [provider]  Vitamin D, Ergocalciferol, (DRISDOL) 1.25 MG (50000 UNIT) CAPS capsule Take 50,000 Units by mouth every 7 (seven) days. 04/17/19  Yes [provider]  azithromycin (ZITHROMAX) 250 MG tablet Take 1 tablet (250 mg total) by mouth daily. Take first 2 tablets together, then 1 every day until finished. 08/03/23   Tomi Bamberger, PA-C  benzonatate (TESSALON) 100 MG capsule Take 1 capsule  (100 mg total) by mouth every 8 (eight) hours. 12/03/22   White, Elita Boone, NP  EPINEPHrine 0.3 mg/0.3 mL IJ SOAJ injection Inject 0.3 mg into the muscle as needed for anaphylaxis.    [provider]  ipratropium (ATROVENT) 0.03 % nasal spray Place 2 sprays into both nostrils every 12 (twelve) hours. 12/03/22   Valinda Hoar, NP  promethazine-dextromethorphan (PROMETHAZINE-DM) 6.25-15 MG/5ML syrup Take 5 mLs by mouth 4 (four) times daily as needed for cough. 12/03/22   Valinda Hoar, NP    Family History History reviewed. No pertinent family history.  Social History Social History   Tobacco Use   Smoking status: Never   Smokeless tobacco: Never  Substance Use Topics   Alcohol use: Never   Drug use: Never     Allergies   Peanut-containing drug products and Prednisone   Review of Systems Review of Systems  Constitutional:  Negative for chills and fever.  HENT:  Positive for congestion and rhinorrhea.   Respiratory:  Positive for cough and shortness of breath.   Cardiovascular:  Negative for chest pain.  Gastrointestinal:  Negative for abdominal pain, nausea and vomiting.  Musculoskeletal:  Positive for myalgias. Negative for back pain.     Physical Exam Triage Vital Signs ED Triage Vitals  Encounter Vitals Group     BP      Systolic BP Percentile      Diastolic BP Percentile      Pulse  Resp      Temp      Temp src      SpO2      Weight      Height      Head Circumference      Peak Flow      Pain Score      Pain Loc      Pain Education      Exclude from Growth Chart    No data found.  Updated Vital Signs BP 127/89 (BP Location: Left Arm)   Pulse 76   Temp 98.5 F (36.9 C) (Oral)   Resp 18   SpO2 98%     Physical Exam Vitals and nursing note reviewed.  Constitutional:      General: She is not in acute distress.    Appearance: Normal appearance. She is not ill-appearing.  HENT:     Head: Normocephalic and atraumatic.     Nose:  Congestion present.     Mouth/Throat:     Mouth: Mucous membranes are moist.     Pharynx: No oropharyngeal exudate or posterior oropharyngeal erythema.  Eyes:     Conjunctiva/sclera: Conjunctivae normal.  Cardiovascular:     Rate and Rhythm: Normal rate and regular rhythm.     Heart sounds: Normal heart sounds. No murmur heard. Pulmonary:     Effort: Pulmonary effort is normal. No respiratory distress.     Breath sounds: Normal breath sounds. No wheezing, rhonchi or rales.  Skin:    General: Skin is warm and dry.  Neurological:     Mental Status: She is alert.  Psychiatric:        Mood and Affect: Mood normal.        Thought Content: Thought content normal.      UC Treatments / Results  Labs (all labs ordered are listed, but only abnormal results are displayed) Labs Reviewed - No data to display  EKG   Radiology No results found.  Procedures Procedures (including critical care time)  Medications Ordered in UC Medications - No data to display  Initial Impression / Assessment and Plan / UC Course  I have reviewed the triage vital signs and the nursing notes.  Pertinent labs & imaging results that were available during my care of the patient were reviewed by me and considered in my medical decision making (see chart for details).    Will treat to cover suspected sinobronchitis with Z-Pak.  Recommended follow-up if no gradual improvement with any further concerns.  Final Clinical Impressions(s) / UC Diagnoses   Final diagnoses:  Sinobronchitis     Discharge Instructions      Follow up if no gradual improvement or with any further concerns.      ED Prescriptions     Medication Sig Dispense Auth. Provider   azithromycin (ZITHROMAX) 250 MG tablet Take 1 tablet (250 mg total) by mouth daily. Take first 2 tablets together, then 1 every day until finished. 6 tablet Tomi Bamberger, PA-C      PDMP not reviewed this encounter.   Tomi Bamberger,  PA-C 08/27/23 [phone]

## 2023-08-03 NOTE — ED Triage Notes (Signed)
SOB, cough, body aches that started a week ago. Pt was tested for covid and flu Wednesday and all was negative. Taking mucinex, TheraFlu, cough syrup with no relief of symptoms.

## 2024-03-18 ENCOUNTER — Emergency Department (HOSPITAL_BASED_OUTPATIENT_CLINIC_OR_DEPARTMENT_OTHER)
Admission: EM | Admit: 2024-03-18 | Discharge: 2024-03-18 | Disposition: A | Discharge status: Home / Self Care | Attending: Emergency Medicine | Admitting: Emergency Medicine

## 2024-03-18 ENCOUNTER — Emergency Department (HOSPITAL_BASED_OUTPATIENT_CLINIC_OR_DEPARTMENT_OTHER)

## 2024-03-18 ENCOUNTER — Encounter (HOSPITAL_BASED_OUTPATIENT_CLINIC_OR_DEPARTMENT_OTHER): Payer: Self-pay

## 2024-03-18 ENCOUNTER — Other Ambulatory Visit: Payer: Self-pay

## 2024-03-18 ENCOUNTER — Ambulatory Visit: Admission: EM | Admit: 2024-03-18 | Discharge: 2024-03-18 | Disposition: A | Discharge status: Home / Self Care

## 2024-03-18 DIAGNOSIS — M79662 Pain in left lower leg: Secondary | ICD-10-CM | POA: Insufficient documentation

## 2024-03-18 DIAGNOSIS — R202 Paresthesia of skin: Secondary | ICD-10-CM | POA: Insufficient documentation

## 2024-03-18 DIAGNOSIS — Z91010 Allergy to peanuts: Secondary | ICD-10-CM | POA: Insufficient documentation

## 2024-03-18 DIAGNOSIS — M7989 Other specified soft tissue disorders: Secondary | ICD-10-CM | POA: Diagnosis not present

## 2024-03-18 DIAGNOSIS — M79605 Pain in left leg: Secondary | ICD-10-CM | POA: Diagnosis not present

## 2024-03-18 DIAGNOSIS — Z7901 Long term (current) use of anticoagulants: Secondary | ICD-10-CM | POA: Diagnosis not present

## 2024-03-18 NOTE — Discharge Instructions (Signed)
 Your ultrasound was negative for DVT today.  Please continue basic care for musculoskeletal pain including ice/heat, OTC NSAIDs or Tylenol , rest, gentle stretching.  Please follow-up with your doctor in 1 week if not improving.  Return to the emergency department sooner for any worsening pain, swelling, redness, difficulty breathing or chest pain, other symptoms.

## 2024-03-18 NOTE — ED Triage Notes (Signed)
 Pt c/o L leg pain/ tingling in foot first noticed Friday. Seen at [facility] for same, sent for r/o DVT. Hx of same, advises it feel similar.

## 2024-03-18 NOTE — ED Notes (Signed)
 RN reviewed discharge instructions with pt. Pt verbalized understanding and had no further questions. VSS upon discharge.

## 2024-03-18 NOTE — ED Triage Notes (Signed)
 Pt reports left leg pain in the crease of the leg, swelling, tingling hx of clots. Off gait. States she noticed the swelling after her work out on Thursday.

## 2024-03-18 NOTE — ED Provider Notes (Signed)
 [facility]    CSN: [id number] Arrival date & time: 03/18/24  0912      History   Chief Complaint No chief complaint on file.   HPI Kelly Flowers is a 58 y.o. female.   Patient presents today for evaluation of leg pain to the left crease of her leg at her knee with some associated swelling.  She reports that she has history of DVT.  She states walking makes pain worse.  She notes that swelling started after she worked out Thursday of last week.  She reports 1 episode of shortness of breath last night but nothing that is persistent.  She denies any chest pain.     History reviewed. No pertinent past medical history.  Patient Active Problem List   Diagnosis Date Noted   S/P laparoscopic cholecystectomy 05/16/2019   DVT of lower extremity (deep venous thrombosis) ([facility]) 10/16/2010   FATIGUE 07/10/2008   Obesity 03/28/2008   DEEP VENOUS THROMBOPHLEBITIS, LEFT, LEG, HX OF 03/28/2008    History reviewed. No pertinent surgical history.  OB History   No obstetric history on file.      Home Medications    Prior to Admission medications   Medication Sig Start Date End Date Taking? Authorizing Provider  amitriptyline (ELAVIL) 10 MG tablet Take 10 mg by mouth at bedtime as needed for sleep. 04/14/20   [provider]  azithromycin  (ZITHROMAX ) 250 MG tablet Take 1 tablet (250 mg total) by mouth daily. Take first 2 tablets together, then 1 every day until finished. 08/03/23   Vernestine Gondola, PA-C  benzonatate  (TESSALON ) 100 MG capsule Take 1 capsule (100 mg total) by mouth every 8 (eight) hours. 12/03/22   White, Maybelle Spatz, NP  cyclobenzaprine  (FLEXERIL ) 10 MG tablet Take 10 mg by mouth 3 (three) times daily as needed for muscle spasms. 09/21/19   [provider]  EPINEPHrine 0.3 mg/0.3 mL IJ SOAJ injection Inject 0.3 mg into the muscle as needed for anaphylaxis.    [provider]  ipratropium (ATROVENT ) 0.03 % nasal spray Place 2 sprays  into both nostrils every 12 (twelve) hours. 12/03/22   White, Maybelle Spatz, NP  promethazine -dextromethorphan (PROMETHAZINE -DM) 6.25-15 MG/5ML syrup Take 5 mLs by mouth 4 (four) times daily as needed for cough. 12/03/22   White, Adrienne R, NP  Vitamin D, Ergocalciferol, (DRISDOL) 1.25 MG (50000 UNIT) CAPS capsule Take 50,000 Units by mouth every 7 (seven) days. 04/17/19   [provider]    Family History History reviewed. No pertinent family history.  Social History Social History   Tobacco Use   Smoking status: Never   Smokeless tobacco: Never  Substance Use Topics   Alcohol use: Never   Drug use: Never     Allergies   Peanut-containing drug products and Prednisone    Review of Systems Review of Systems  Constitutional:  Negative for chills and fever.  Eyes:  Negative for discharge and redness.  Respiratory:  Negative for shortness of breath.   Cardiovascular:  Positive for leg swelling. Negative for chest pain.  Gastrointestinal:  Negative for abdominal pain, nausea and vomiting.     Physical Exam Triage Vital Signs ED Triage Vitals  Encounter Vitals Group     BP      Girls Systolic BP Percentile      Girls Diastolic BP Percentile      Boys Systolic BP Percentile      Boys Diastolic BP Percentile      Pulse  Resp      Temp      Temp src      SpO2      Weight      Height      Head Circumference      Peak Flow      Pain Score      Pain Loc      Pain Education      Exclude from Growth Chart    No data found.  Updated Vital Signs BP 135/81 (BP Location: Right Arm)   Pulse 63   Temp 98.1 F (36.7 C) (Oral)   Resp 20   SpO2 96%   Visual Acuity Right Eye Distance:   Left Eye Distance:   Bilateral Distance:    Right Eye Near:   Left Eye Near:    Bilateral Near:     Physical Exam Vitals and nursing note reviewed.  Constitutional:      General: She is not in acute distress.    Appearance: Normal appearance. She is not ill-appearing.   HENT:     Head: Normocephalic and atraumatic.   Eyes:     Conjunctiva/sclera: Conjunctivae normal.    Cardiovascular:     Rate and Rhythm: Normal rate.  Pulmonary:     Effort: Pulmonary effort is normal. No respiratory distress.   Musculoskeletal:     Comments: Mild swelling appreciated to left lower leg with tenderness to palpation to left calf.   Neurological:     Mental Status: She is alert.   Psychiatric:        Mood and Affect: Mood normal.        Behavior: Behavior normal.        Thought Content: Thought content normal.      UC Treatments / Results  Labs (all labs ordered are listed, but only abnormal results are displayed) Labs Reviewed - No data to display  EKG   Radiology US  Venous Img Lower Unilateral Left Result Date: 03/18/2024 CLINICAL DATA:  Left leg pain and swelling and tingling for 5 days EXAM: Left LOWER EXTREMITY VENOUS DOPPLER ULTRASOUND TECHNIQUE: Gray-scale sonography with compression, as well as color and duplex ultrasound, were performed to evaluate the deep venous system(s) from the level of the common femoral vein through the popliteal and proximal calf veins. COMPARISON:  None Available. FINDINGS: VENOUS Normal compressibility of the common femoral, superficial femoral, and popliteal veins, as well as the visualized calf veins. Visualized portions of profunda femoral vein and great saphenous vein unremarkable. No filling defects to suggest DVT on grayscale or color Doppler imaging. Doppler waveforms show normal direction of venous flow, normal respiratory plasticity and response to augmentation. Limited views of the contralateral common femoral vein are unremarkable. OTHER None. Limitations: none IMPRESSION: No evidence of left lower extremity DVT. Electronically Signed   By: Adrianna Horde M.D.   On: 03/18/2024 14:40    Procedures Procedures (including critical care time)  Medications Ordered in UC Medications - No data to display  Initial  Impression / Assessment and Plan / UC Course  I have reviewed the triage vital signs and the nursing notes.  Pertinent labs & imaging results that were available during my care of the patient were reviewed by me and considered in my medical decision making (see chart for details).     Initially tried to get patient to outpatient ultrasound however they reported they did not have any openings at [facility].  Recommended further evaluation in the emergency room for DVT  rule out. Final Clinical Impressions(s) / UC Diagnoses   Final diagnoses:  Pain and swelling of left lower leg     Discharge Instructions       Please report to [id number] and let them know your urgent care provider has ordered an ultrasound.        ED Prescriptions   None    PDMP not reviewed this encounter.   Vernestine Gondola, PA-C 03/18/24 1935

## 2024-03-18 NOTE — Discharge Instructions (Signed)
  Please report to [facility] and let them know your urgent care provider has ordered an ultrasound.

## 2024-03-18 NOTE — ED Provider Notes (Signed)
 [place] EMERGENCY DEPARTMENT AT [facility] Provider Note   CSN: [id number] Arrival date & time: 03/18/24  1350     Patient presents with: Leg Pain (L)   Kelly Flowers is a 58 y.o. female.   Patient with remote history of DVT, not currently on anticoagulation, presents to the emergency department for evaluation of left lower extremity pain and tingling.  Symptoms were in the proximal calf.  It started about 3 days ago.  Patient was on a cruise recently.  She had a flight to [place].  Symptoms started several days after this.  No chest pain or shortness of breath.  Patient was concerned that she had recurrent blood clot.  She was seen at urgent care and then sent to the emergency department for ultrasound.  In addition, patient had an episode of left hand tingling without weakness last night that was transient and resolved.  No neck pain or history of lumbar radiculopathy.  No history of stroke. Patient denies signs of stroke including: facial droop, slurred speech, aphasia, weakness/numbness in extremities, imbalance/trouble walking.        Prior to Admission medications   Medication Sig Start Date End Date Taking? Authorizing Provider  amitriptyline (ELAVIL) 10 MG tablet Take 10 mg by mouth at bedtime as needed for sleep. 04/14/20   [provider]  apixaban  (ELIQUIS ) 2.5 MG TABS tablet Take 2.5 mg by mouth daily. 04/12/19   [provider]  azithromycin  (ZITHROMAX ) 250 MG tablet Take 1 tablet (250 mg total) by mouth daily. Take first 2 tablets together, then 1 every day until finished. 08/03/23   Vernestine Gondola, PA-C  benzonatate  (TESSALON ) 100 MG capsule Take 1 capsule (100 mg total) by mouth every 8 (eight) hours. 12/03/22   White, Maybelle Spatz, NP  cyclobenzaprine  (FLEXERIL ) 10 MG tablet Take 10 mg by mouth 3 (three) times daily as needed for muscle spasms. 09/21/19   [provider]  EPINEPHrine 0.3 mg/0.3 mL IJ SOAJ injection Inject 0.3 mg into  the muscle as needed for anaphylaxis.    [provider]  ipratropium (ATROVENT ) 0.03 % nasal spray Place 2 sprays into both nostrils every 12 (twelve) hours. 12/03/22   Reena Canning, NP  promethazine -dextromethorphan (PROMETHAZINE -DM) 6.25-15 MG/5ML syrup Take 5 mLs by mouth 4 (four) times daily as needed for cough. 12/03/22   White, Adrienne R, NP  Vitamin D, Ergocalciferol, (DRISDOL) 1.25 MG (50000 UNIT) CAPS capsule Take 50,000 Units by mouth every 7 (seven) days. 04/17/19   [provider]    Allergies: Peanut-containing drug products and Prednisone     Review of Systems  Updated Vital Signs BP (!) 147/92 (BP Location: Left Arm)   Pulse 66   Temp 98 F (36.7 C)   Resp 16   SpO2 100%   Physical Exam Vitals and nursing note reviewed.  Constitutional:      Appearance: She is well-developed.  HENT:     Head: Normocephalic and atraumatic.     Right Ear: External ear normal.     Left Ear: External ear normal.     Nose: Nose normal.     Mouth/Throat:     Pharynx: Uvula midline.   Eyes:     General: Lids are normal.     Extraocular Movements:     Right eye: No nystagmus.     Left eye: No nystagmus.     Conjunctiva/sclera: Conjunctivae normal.     Pupils: Pupils are equal, round, and reactive to light.  Cardiovascular:     Rate and Rhythm: Normal rate and regular rhythm.     Pulses:          Dorsalis pedis pulses are 2+ on the right side and 2+ on the left side.  Pulmonary:     Effort: Pulmonary effort is normal. No respiratory distress.     Breath sounds: Normal breath sounds.  Abdominal:     Palpations: Abdomen is soft.     Tenderness: There is no abdominal tenderness.   Musculoskeletal:     Cervical back: Normal range of motion and neck supple. No tenderness or bony tenderness.     Left knee: Normal range of motion. No tenderness.     Right lower leg: No swelling. No edema.     Left lower leg: Tenderness present. No swelling. No edema.     Left  ankle: No tenderness. Normal range of motion.     Left foot: Normal range of motion. No tenderness.       Legs:   Skin:    General: Skin is warm and dry.   Neurological:     Mental Status: She is alert and oriented to person, place, and time.     GCS: GCS eye subscore is 4. GCS verbal subscore is 5. GCS motor subscore is 6.     Cranial Nerves: No cranial nerve deficit.     Sensory: No sensory deficit.     Motor: No weakness.     Coordination: Coordination normal.     Gait: Gait normal.     Comments: Upper extremity myotomes tested bilaterally:  C5 Shoulder abduction 5/5 C6 Elbow flexion/wrist extension 5/5 C7 Elbow extension 5/5 C8 Finger flexion 5/5 T1 Finger abduction 5/5  Lower extremity myotomes tested bilaterally: L2 Hip flexion 5/5 L3 Knee extension 5/5 L4 Ankle dorsiflexion 5/5 S1 Ankle plantar flexion 5/5    Patient seen and examined. History obtained directly from patient.  Reviewed urgent care note.  Labs/EKG: None ordered  Imaging: Result reviewed including DVT study: Was negative  Medications/Fluids: None ordered  Most recent vital signs reviewed and are as follows: BP (!) 147/92 (BP Location: Left Arm)   Pulse 66   Temp 98 F (36.7 C)   Resp 16   SpO2 100%   Initial impression: Patient with reassuring neurologic exam, left lower extremity exam.  No evidence of DVT on ultrasound.  Home treatment plan:  Advised routine care for injuries at this time including ice/heat, gentle stretching, OTC meds as needed.  Return instructions discussed with patient: Return with new or worsening symptoms, chest pain, shortness of breath, worsening pain, redness, swelling of the lower extremity.  Follow-up instructions discussed with patient: PCP in 1 week if not improving.   (all labs ordered are listed, but only abnormal results are displayed) Labs Reviewed - No data to display  EKG: None  Radiology: US  Venous Img Lower Unilateral Left Result Date:  03/18/2024 CLINICAL DATA:  Left leg pain and swelling and tingling for 5 days EXAM: Left LOWER EXTREMITY VENOUS DOPPLER ULTRASOUND TECHNIQUE: Gray-scale sonography with compression, as well as color and duplex ultrasound, were performed to evaluate the deep venous system(s) from the level of the common femoral vein through the popliteal and proximal calf veins. COMPARISON:  None Available. FINDINGS: VENOUS Normal compressibility of the common femoral, superficial femoral, and popliteal veins, as well as the visualized calf veins. Visualized portions of profunda femoral vein and great saphenous vein unremarkable. No filling defects to suggest DVT on  grayscale or color Doppler imaging. Doppler waveforms show normal direction of venous flow, normal respiratory plasticity and response to augmentation. Limited views of the contralateral common femoral vein are unremarkable. OTHER None. Limitations: none IMPRESSION: No evidence of left lower extremity DVT. Electronically Signed   By: Adrianna Horde M.D.   On: 03/18/2024 14:40     Procedures   Medications Ordered in the ED - No data to display                                  Medical Decision Making  Patient presents to the emergency department with some pain and tingling in the left proximal calf.  History of DVT.  Lower extremity venous Doppler today was negative.  She did have a recent trip.  She is exercising and walking normally, but has not overexerted herself recently.  Most likely musculoskeletal pain.  Cannot entirely rule out a neuropathic type process.  Patient did have some tingling in her left hand earlier, however this has resolved.  No focal neuro deficits on exam.  Low concern for CVA or cervical radiculopathy.  Her exam is benign and reassuring, do not feel that she requires transfer for advanced imaging at this time.  Would advise basic care with follow-up if not improving.     Final diagnoses:  Pain of left calf    ED Discharge Orders      None          Lyna Sandhoff, PA-C 03/18/24 1531    Sallyanne Creamer, DO 03/19/24 0036

## 2024-03-29 ENCOUNTER — Encounter: Payer: Self-pay | Admitting: Family

## 2024-03-29 ENCOUNTER — Ambulatory Visit (INDEPENDENT_AMBULATORY_CARE_PROVIDER_SITE_OTHER): Admitting: Family

## 2024-03-29 ENCOUNTER — Ambulatory Visit: Payer: Self-pay | Admitting: Family

## 2024-03-29 VITALS — BP 135/87 | HR 88 | Temp 98.6°F | Resp 16 | Ht 64.0 in | Wt 219.0 lb

## 2024-03-29 DIAGNOSIS — Z7689 Persons encountering health services in other specified circumstances: Secondary | ICD-10-CM

## 2024-03-29 DIAGNOSIS — M79662 Pain in left lower leg: Secondary | ICD-10-CM | POA: Diagnosis not present

## 2024-03-29 DIAGNOSIS — R635 Abnormal weight gain: Secondary | ICD-10-CM | POA: Diagnosis not present

## 2024-03-29 DIAGNOSIS — Z6837 Body mass index (BMI) 37.0-37.9, adult: Secondary | ICD-10-CM

## 2024-03-29 DIAGNOSIS — Z131 Encounter for screening for diabetes mellitus: Secondary | ICD-10-CM | POA: Diagnosis not present

## 2024-03-29 LAB — POCT GLYCOSYLATED HEMOGLOBIN (HGB A1C): Hemoglobin A1C: 5.2 % (ref 4.0–5.6)

## 2024-03-29 MED ORDER — PHENTERMINE HCL 15 MG PO CAPS: 15.0000 mg | ORAL_CAPSULE | ORAL | 0 refills | Status: DC

## 2024-03-29 NOTE — Progress Notes (Signed)
 Follow up from urgent care, weight loss

## 2024-03-29 NOTE — Progress Notes (Signed)
 Subjective:    Kelly Flowers - 58 y.o. female MRN [id number]  Date of birth: November 18, 1965  HPI  Kelly Flowers is to establish care and Emergency Department Follow-Up.  Current issues and/or concerns: - Patient recently seen at [facility] Emergency Department at [facility] on 03/18/2024 for pain of left calf. Lower extremity venous doppler was negative at that time. Today patient states left lower extremity pain persisting. Denies red flag symptoms. - Diabetes screening. - Weight gain. States she would like to lose 30 pounds. Goal weight 190 pounds. She is vegan. She exercises. She would like to try weight loss pill and referral to specialist. - No further issues/concerns for discussion today.   ROS per HPI    Health Maintenance:  Health Maintenance Due  Topic Date Due   HIV Screening  Never done   Hepatitis C Screening  Never done   Cervical Cancer Screening (HPV/Pap Cotest)  Never done   Colonoscopy  Never done   MAMMOGRAM  10/12/2018     Past Medical History: Patient Active Problem List   Diagnosis Date Noted   S/P laparoscopic cholecystectomy 05/16/2019   DVT of lower extremity (deep venous thrombosis) (HCC) 10/16/2010   FATIGUE 07/10/2008   Obesity 03/28/2008   DEEP VENOUS THROMBOPHLEBITIS, LEFT, LEG, HX OF 03/28/2008      Social History   reports that she has never smoked. She has never used smokeless tobacco. She reports that she does not drink alcohol and does not use drugs.   Family History  family history is not on file.   Medications: reviewed and updated   Objective:   Physical Exam BP 135/87   Pulse 88   Temp 98.6 F (37 C) (Oral)   Resp 16   Ht 5' 4 (1.626 m)   Wt 219 lb (99.3 kg)   BMI 37.59 kg/m   Physical Exam HENT:     Head: Normocephalic and atraumatic.     Nose: Nose normal.     Mouth/Throat:     Mouth: Mucous membranes are moist.     Pharynx: Oropharynx is clear.   Eyes:     Extraocular Movements: Extraocular  movements intact.     Conjunctiva/sclera: Conjunctivae normal.     Pupils: Pupils are equal, round, and reactive to light.    Cardiovascular:     Rate and Rhythm: Normal rate and regular rhythm.     Pulses: Normal pulses.     Heart sounds: Normal heart sounds.  Pulmonary:     Effort: Pulmonary effort is normal.     Breath sounds: Normal breath sounds.   Musculoskeletal:        General: Normal range of motion.     Cervical back: Normal range of motion and neck supple.     Right hip: Normal.     Left hip: Normal.     Right upper leg: Normal.     Left upper leg: Normal.     Right knee: Normal.     Left knee: Normal.     Right lower leg: Normal.     Left lower leg: Normal.     Right ankle: Normal.     Left ankle: Normal.     Right foot: Normal.     Left foot: Normal.   Neurological:     General: No focal deficit present.     Mental Status: She is alert and oriented to person, place, and time.   Psychiatric:        Mood and  Affect: Mood normal.        Behavior: Behavior normal.       Assessment & Plan:  1. Encounter to establish care (Primary) - Patient presents today to establish care. During the interim follow-up with primary provider as scheduled.  - Return for annual physical examination, labs, and health maintenance. Arrive fasting meaning having no food for at least 8 hours prior to appointment. You may have only water or black coffee. Please take scheduled medications as normal.  2. Pain of left calf - Referral to Orthopedic Surgery for evaluation/management.  - Ambulatory referral to Orthopedic Surgery  3. Diabetes mellitus screening - Routine screening.  - POCT glycosylated hemoglobin (Hb A1C)  4. Encounter for weight management 5. BMI 37.0-37.9, adult 6. Weight gain - Phentermine as prescribed. Counseled on medication adherence and adverse effects. - I did check the [place]  prescription drug database. - Counseled on low-sodium DASH diet and 150  minutes of moderate intensity exercise per week as tolerated to assist with weight management.  - Referral to Medical Weight Management for evaluation/management. - Follow-up with primary provider in 4 weeks or sooner if needed. - phentermine 15 MG capsule; Take 1 capsule (15 mg total) by mouth every morning.  Dispense: 30 capsule; Refill: 0 - Amb Ref to Medical Weight Management    Patient was given clear instructions to go to Emergency Department or return to medical center if symptoms don't improve, worsen, or new problems develop.The patient verbalized understanding.  I discussed the assessment and treatment plan with the patient. The patient was provided an opportunity to ask questions and all were answered. The patient agreed with the plan and demonstrated an understanding of the instructions.   The patient was advised to call back or seek an in-person evaluation if the symptoms worsen or if the condition fails to improve as anticipated.    Greig Drones, NP 03/29/2024, 3:24 PM Primary Care at [facility]

## 2024-04-01 ENCOUNTER — Ambulatory Visit: Admitting: Physician Assistant

## 2024-04-11 ENCOUNTER — Encounter: Payer: Self-pay | Admitting: Physician Assistant

## 2024-04-11 ENCOUNTER — Other Ambulatory Visit (INDEPENDENT_AMBULATORY_CARE_PROVIDER_SITE_OTHER): Payer: Self-pay

## 2024-04-11 ENCOUNTER — Ambulatory Visit: Admitting: Physician Assistant

## 2024-04-11 DIAGNOSIS — M25562 Pain in left knee: Secondary | ICD-10-CM

## 2024-04-11 DIAGNOSIS — G8929 Other chronic pain: Secondary | ICD-10-CM

## 2024-04-11 MED ORDER — MELOXICAM 15 MG PO TABS: 15.0000 mg | ORAL_TABLET | Freq: Every day | ORAL | 0 refills | Status: DC

## 2024-04-11 NOTE — Progress Notes (Signed)
 Office Visit Note   Patient: Kelly Flowers           Date of Birth: 10-04-1965           MRN: [id number] Visit Date: 04/11/2024              Requested by: Lorren Greig PARAS, NP [address] [place],  [place] [address] PCP: Lorren Greig PARAS, NP   Assessment & Plan: Visit Diagnoses:  1. Chronic pain of left knee     Plan: Pleasant 58 year old woman comes in today with a history of left knee pain especially posteriorly.  She was evaluated and DVT was ruled out.  She has been working out of the gym doing deep squats.  Examination today she seems to have some cervical circumferential tenderness over the medial andlateral joint line I cannot palpate a Baker's cyst today she has no indication of pain in her calf she has good strength.  I recommended a trial of PT she enjoys working out at [employer] and I am hoping they can work with her to modify some of her exercises.  Also place her on meloxicam  she knows she is not to take other anti-inflammatories with this.  Will follow-up with me in a month  Follow-Up Instructions: Return in about 1 month (around 05/12/2024).   Orders:  Orders Placed This Encounter  Procedures   XR KNEE 3 VIEW LEFT   Meds ordered this encounter  Medications   meloxicam  (MOBIC ) 15 MG tablet    Sig: Take 1 tablet (15 mg total) by mouth daily.    Dispense:  30 tablet    Refill:  0      Procedures: No procedures performed   Clinical Data: No additional findings.   Subjective: No chief complaint on file.   HPI patient is a pleasant 58 year old woman with a chief complaint of pain in the back of her left knee.  Been going on since the first week in June she has no injuries.  She has tried ibuprofen Tylenol  and teas this is not helping pain is worse when sitting she has to rock to get up.  She is active in the gym working out.  Review of Systems  All other systems reviewed and are negative.    Objective: Vital Signs: There were no vitals taken  for this visit.  Physical Exam Constitutional:      Appearance: Normal appearance.  Pulmonary:     Effort: Pulmonary effort is normal.  Skin:    General: Skin is warm and dry.  Neurological:     General: No focal deficit present.     Mental Status: She is alert and oriented to person, place, and time.  Psychiatric:        Mood and Affect: Mood normal.        Behavior: Behavior normal.     Ortho Exam Left knee no effusion no erythema no warmth.  Compartments are soft and nontender negative Homans' sign cannot structurally palpate a Baker's cyst in the posterior popliteal fossa she has some generalized tenderness to the medial lateral joint line she has good strength with resisted extension and flexion.  Good stability Specialty Comments:  No specialty comments available.  Imaging: XR KNEE 3 VIEW LEFT Result Date: 04/11/2024 Radiographs of the left knee demonstrate some mild degenerative changes no acute fracture seen overall neutral alignment    PMFS History: Patient Active Problem List   Diagnosis Date Noted   S/P laparoscopic cholecystectomy  05/16/2019   DVT of lower extremity (deep venous thrombosis) (HCC) 10/16/2010   FATIGUE 07/10/2008   Obesity 03/28/2008   DEEP VENOUS THROMBOPHLEBITIS, LEFT, LEG, HX OF 03/28/2008   History reviewed. No pertinent past medical history.  History reviewed. No pertinent family history.  History reviewed. No pertinent surgical history. Social History   Occupational History   Not on file  Tobacco Use   Smoking status: Never   Smokeless tobacco: Never  Vaping Use   Vaping status: Never Used  Substance and Sexual Activity   Alcohol use: Never   Drug use: Never   Sexual activity: Not on file

## 2024-04-11 NOTE — Addendum Note (Signed)
 Addended by: RODGERS LACY on: 04/11/2024 10:29 AM   Modules accepted: Orders

## 2024-04-24 ENCOUNTER — Ambulatory Visit: Attending: Physician Assistant

## 2024-04-24 NOTE — Therapy (Incomplete)
 OUTPATIENT PHYSICAL THERAPY LOWER EXTREMITY EVALUATION   Patient Name: Kelly Kelly Flowers MRN: [id number] DOB:1966-04-23, 58 y.o., female Today's Date: 04/24/2024  END OF SESSION:   No past medical history on file. No past surgical history on file. Patient Active Problem List   Diagnosis Date Noted   S/P laparoscopic cholecystectomy 05/16/2019   DVT of lower extremity (deep venous thrombosis) (HCC) 10/16/2010   FATIGUE 07/10/2008   Obesity 03/28/2008   DEEP VENOUS THROMBOPHLEBITIS, LEFT, LEG, HX OF 03/28/2008    PCP: Kelly Greig PARAS, NP  REFERRING PROVIDER:  Persons, Ronal Dragon, PA   REFERRING DIAG:  [phone] (ICD-10-CM) - Chronic pain of left knee     THERAPY DIAG:  No diagnosis found.  Rationale for Evaluation and Treatment: Rehabilitation  ONSET DATE: ***  SUBJECTIVE:   SUBJECTIVE STATEMENT: Patient reports to PT with L knee pain that has been present ***. She states that her pain is primarily ***. She would like to be able to *** without pain.   Referring provider notes she was evaluated and DVT was ruled out   PERTINENT HISTORY: Relevant PMHx includes LE DVT (2009, 2012)  PAIN:  Are you having pain?  Yes: NPRS scale: *** Pain location: *** Pain description: *** Aggravating factors: *** Relieving factors: ***  PRECAUTIONS: {Therapy precautions:24002}  RED FLAGS: {PT Red Flags:29287}   WEIGHT BEARING RESTRICTIONS: {Yes ***/No:24003}  FALLS:  Has patient fallen in last 6 months? {fallsyesno:27318}  LIVING ENVIRONMENT: Lives with: {OPRC lives with:25569::lives with their family} Lives in: {Lives in:25570} Stairs: {opstairs:27293} Has following equipment at home: {Assistive devices:23999}  OCCUPATION: ***  PLOF: {PLOF:24004}  PATIENT GOALS: ***  NEXT MD VISIT: 05/01/2024 with PCP  OBJECTIVE:  Note: Objective measures were completed at Evaluation unless otherwise noted.  DIAGNOSTIC FINDINGS:   XR KNEE 3 VIEW LEFT Result Date:  04/11/2024 Radiographs of the left knee demonstrate some mild degenerative changes no acute fracture seen overall neutral alignment  PATIENT SURVEYS:  LEFS  Extreme difficulty/unable (0), Quite a bit of difficulty (1), Moderate difficulty (2), Little difficulty (3), No difficulty (4) Survey date:  04/24/2024   Any of your usual work, housework or school activities   2. Usual hobbies, recreational or sporting activities   3. Getting into/out of the bath   4. Walking between rooms   5. Putting on socks/shoes   6. Squatting    7. Lifting an object, like a bag of groceries from the floor   8. Performing light activities around your home   9. Performing heavy activities around your home   10. Getting into/out of a car   11. Walking 2 blocks   12. Walking 1 mile   13. Going up/down 10 stairs (1 flight)   14. Standing for 1 hour   15.  sitting for 1 hour   16. Running on even ground   17. Running on uneven ground   18. Making sharp turns while running fast   19. Hopping    20. Rolling over in bed   Score total:  ***     COGNITION: Overall cognitive status: Within functional limits for tasks assessed     SENSATION: {sensation:27233}  EDEMA:  {edema:24020}  MUSCLE LENGTH: Hamstrings: Right *** deg; Left *** deg Kelly Kelly Flowers: Right *** deg; Left *** deg  PALPATION: ***  LOWER EXTREMITY ROM:  Active ROM Right eval Left eval  Hip flexion    Hip extension    Hip abduction    Hip adduction    Hip internal  rotation    Hip external rotation    Knee flexion    Knee extension    Ankle dorsiflexion    Ankle plantarflexion    Ankle inversion    Ankle eversion     (Blank rows = not tested)  LOWER EXTREMITY MMT:  MMT Right eval Left eval  Hip flexion    Hip extension    Hip abduction    Hip adduction    Hip internal rotation    Hip external rotation    Knee flexion    Knee extension    Ankle dorsiflexion    Ankle plantarflexion    Ankle inversion    Ankle  eversion     (Blank rows = not tested)  LOWER EXTREMITY SPECIAL TESTS:  Knee special tests: {KNEE SPECIAL TESTS:26240}  FUNCTIONAL TESTS:  30 seconds chair stand Kelly Flowers  GAIT: Distance walked: 50 feet from lobby to evaluation room  Assistive device utilized: None Level of assistance: Complete Independence Comments: ***                                                                                                                                TREATMENT DATE:   [facility] Adult PT Treatment:                                                DATE: 04/24/2024   Initial evaluation: see patient education and home exercise program as noted below      PATIENT EDUCATION:  Education details: reviewed initial home exercise program; discussion of POC, prognosis and goals for skilled PT   Person educated: Patient Education method: Explanation, Demonstration, and Handouts Education comprehension: verbalized understanding, returned demonstration, and needs further education  HOME EXERCISE PROGRAM: ***  ASSESSMENT:  CLINICAL IMPRESSION: Kelly Kelly Flowers is a 58 y.o. female who was seen today for physical therapy evaluation and treatment for ***. He/She is demonstrating ***. He/She has related pain and difficulty with ***. He/She requires skilled PT services at this time to address relevant deficits and improve overall function.     OBJECTIVE IMPAIRMENTS: {opptimpairments:25111}.   ACTIVITY LIMITATIONS: {activitylimitations:27494}  PARTICIPATION LIMITATIONS: {participationrestrictions:25113}  PERSONAL FACTORS: {Personal factors:25162} are also affecting patient's functional outcome.   REHAB POTENTIAL: {rehabpotential:25112}  CLINICAL DECISION MAKING: {clinical decision making:25114}  EVALUATION COMPLEXITY: {Evaluation complexity:25115}   GOALS: Goals reviewed with patient? YES  SHORT TERM GOALS: Target date: 05/15/2024   Patient will be independent with initial home program at least 3  days/week.  Baseline: provided at eval Goal Status: INITIAL   2.  Patient will demonstrate improved postural awareness for at least 15 minutes while seated without need for cueing from PT.  Baseline: see objective measures Goal Status: INITIAL   3.  *** Baseline:  Goal status: INITIAL   LONG TERM GOALS: Target date: 06/05/2024  Patient will report improved overall functional ability with LEFS score of ***.  Baseline:  Goal Status: INITIAL    2.  *** Baseline:  Goal status: INITIAL  3.  *** Baseline:  Goal status: INITIAL  4.  *** Baseline:  Goal status: INITIAL     PLAN:  PT FREQUENCY: 1-2x/week  PT DURATION: 6 weeks  PLANNED INTERVENTIONS: 97164- PT Re-evaluation, 97750- Physical Performance Testing, 97110-Therapeutic exercises, 97530- Therapeutic activity, 97112- Neuromuscular re-education, 97535- Self Care, 02859- Manual therapy, [id number]- Aquatic Therapy, 97016- Vasopneumatic device, [id number]- Ionotophoresis 4mg /ml Dexamethasone, 79439 (1-2 muscles), 20561 (3+ muscles)- Dry Needling, Patient/Family education, Taping, Joint mobilization, Cryotherapy, and Moist heat   For all possible CPT codes, reference the Planned Interventions line above.     Check all conditions that are expected to impact treatment: {Conditions expected to impact treatment:{Conditions expected to impact treatment:28273}   If treatment provided at initial evaluation, no treatment charged due to lack of authorization.       PLAN FOR NEXT SESSION: PIERRETTE Marko Molt, PT 04/24/2024, 5:37 AM

## 2024-05-01 ENCOUNTER — Ambulatory Visit (INDEPENDENT_AMBULATORY_CARE_PROVIDER_SITE_OTHER): Admitting: Family

## 2024-05-01 ENCOUNTER — Encounter: Payer: Self-pay | Admitting: Family

## 2024-05-01 VITALS — BP 116/82 | HR 77 | Temp 98.8°F | Resp 16 | Ht 64.0 in | Wt 217.0 lb

## 2024-05-01 DIAGNOSIS — Z1159 Encounter for screening for other viral diseases: Secondary | ICD-10-CM

## 2024-05-01 DIAGNOSIS — R635 Abnormal weight gain: Secondary | ICD-10-CM | POA: Diagnosis not present

## 2024-05-01 DIAGNOSIS — Z1329 Encounter for screening for other suspected endocrine disorder: Secondary | ICD-10-CM | POA: Diagnosis not present

## 2024-05-01 DIAGNOSIS — Z131 Encounter for screening for diabetes mellitus: Secondary | ICD-10-CM | POA: Diagnosis not present

## 2024-05-01 DIAGNOSIS — Z6837 Body mass index (BMI) 37.0-37.9, adult: Secondary | ICD-10-CM | POA: Diagnosis not present

## 2024-05-01 DIAGNOSIS — Z130 Encounter for screening for diseases of the blood and blood-forming organs and certain disorders involving the immune mechanism: Secondary | ICD-10-CM

## 2024-05-01 DIAGNOSIS — Z13220 Encounter for screening for lipoid disorders: Secondary | ICD-10-CM

## 2024-05-01 DIAGNOSIS — Z13228 Encounter for screening for other metabolic disorders: Secondary | ICD-10-CM

## 2024-05-01 DIAGNOSIS — Z1231 Encounter for screening mammogram for malignant neoplasm of breast: Secondary | ICD-10-CM

## 2024-05-01 DIAGNOSIS — Z124 Encounter for screening for malignant neoplasm of cervix: Secondary | ICD-10-CM

## 2024-05-01 DIAGNOSIS — R5383 Other fatigue: Secondary | ICD-10-CM | POA: Diagnosis not present

## 2024-05-01 DIAGNOSIS — Z0000 Encounter for general adult medical examination without abnormal findings: Secondary | ICD-10-CM | POA: Diagnosis not present

## 2024-05-01 DIAGNOSIS — Z114 Encounter for screening for human immunodeficiency virus [HIV]: Secondary | ICD-10-CM | POA: Diagnosis not present

## 2024-05-01 DIAGNOSIS — Z7689 Persons encountering health services in other specified circumstances: Secondary | ICD-10-CM

## 2024-05-01 DIAGNOSIS — Z1211 Encounter for screening for malignant neoplasm of colon: Secondary | ICD-10-CM

## 2024-05-01 MED ORDER — PHENTERMINE HCL 30 MG PO CAPS: 30.0000 mg | ORAL_CAPSULE | ORAL | 0 refills | Status: DC

## 2024-05-01 NOTE — Progress Notes (Signed)
 Still having pain in her legs

## 2024-05-01 NOTE — Progress Notes (Signed)
 Patient ID: Kelly Flowers, female    DOB: 11-Jun-1966  MRN: [id number]  CC: Annual Exam  Subjective: Kelly Flowers is a 58 y.o. female who presents for annual exam.   Her concerns today include:  - Fatigue.  - Doing well on Phentermine , no issues/concerns. - Reports she established with Orthopedics for leg pain who referred her to Physical Therapy.  Patient Active Problem List   Diagnosis Date Noted   S/P laparoscopic cholecystectomy 05/16/2019   DVT of lower extremity (deep venous thrombosis) (HCC) 10/16/2010   FATIGUE 07/10/2008   Obesity 03/28/2008   DEEP VENOUS THROMBOPHLEBITIS, LEFT, LEG, HX OF 03/28/2008     Current Outpatient Medications on File Prior to Visit  Medication Sig Dispense Refill   amitriptyline (ELAVIL) 10 MG tablet Take 10 mg by mouth at bedtime as needed for sleep.     azithromycin  (ZITHROMAX ) 250 MG tablet Take 1 tablet (250 mg total) by mouth daily. Take first 2 tablets together, then 1 every day until finished. 6 tablet 0   benzonatate  (TESSALON ) 100 MG capsule Take 1 capsule (100 mg total) by mouth every 8 (eight) hours. 21 capsule 0   cyclobenzaprine  (FLEXERIL ) 10 MG tablet Take 10 mg by mouth 3 (three) times daily as needed for muscle spasms.     EPINEPHrine 0.3 mg/0.3 mL IJ SOAJ injection Inject 0.3 mg into the muscle as needed for anaphylaxis.     ipratropium (ATROVENT ) 0.03 % nasal spray Place 2 sprays into both nostrils every 12 (twelve) hours. 30 mL 12   meloxicam  (MOBIC ) 15 MG tablet Take 1 tablet (15 mg total) by mouth daily. 30 tablet 0   promethazine -dextromethorphan (PROMETHAZINE -DM) 6.25-15 MG/5ML syrup Take 5 mLs by mouth 4 (four) times daily as needed for cough. 118 mL 0   Vitamin D , Ergocalciferol , (DRISDOL) 1.25 MG (50000 UNIT) CAPS capsule Take 50,000 Units by mouth every 7 (seven) days.     No current facility-administered medications on file prior to visit.    Allergies  Allergen Reactions   Peanut-Containing Drug Products  Anaphylaxis   Prednisone  Itching    Social History   Socioeconomic History   Marital status: Single    Spouse name: Not on file   Number of children: Not on file   Years of education: Not on file   Highest education level: Not on file  Occupational History   Not on file  Tobacco Use   Smoking status: Never   Smokeless tobacco: Never  Vaping Use   Vaping status: Never Used  Substance and Sexual Activity   Alcohol use: Never   Drug use: Never   Sexual activity: Not on file  Other Topics Concern   Not on file  Social History Narrative   Not on file   Social Drivers of Health   Financial Resource Strain: Low Risk  (03/29/2024)   Overall Financial Resource Strain (CARDIA)    Difficulty of Paying Living Expenses: Not hard at all  Food Insecurity: No Food Insecurity (03/29/2024)   Hunger Vital Sign    Worried About Running Out of Food in the Last Year: Never true    Ran Out of Food in the Last Year: Never true  Transportation Needs: No Transportation Needs (03/29/2024)   PRAPARE - Administrator, Civil Service (Medical): No    Lack of Transportation (Non-Medical): No  Physical Activity: Sufficiently Active (03/29/2024)   Exercise Vital Sign    Days of Exercise per Week: 7 days  Minutes of Exercise per Session: 60 min  Stress: No Stress Concern Present (03/29/2024)   [employer] of Occupational Health - Occupational Stress Questionnaire    Feeling of Stress: Not at all  Social Connections: Moderately Isolated (03/29/2024)   Social Connection and Isolation Panel    Frequency of Communication with Friends and Family: More than three times a week    Frequency of Social Gatherings with Friends and Family: More than three times a week    Attends Religious Services: More than 4 times per year    Active Member of [employer] or Organizations: No    Attends Banker Meetings: Never    Marital Status: Divorced  Catering manager Violence: Not At Risk  (03/29/2024)   Humiliation, Afraid, Rape, and Kick questionnaire    Fear of Current or Ex-Partner: No    Emotionally Abused: No    Physically Abused: No    Sexually Abused: No    History reviewed. No pertinent family history.  History reviewed. No pertinent surgical history.  ROS: Review of Systems Negative except as stated above  PHYSICAL EXAM: BP 116/82   Pulse 77   Temp 98.8 F (37.1 C) (Oral)   Resp 16   Ht 5' 4 (1.626 m)   Wt 217 lb (98.4 kg)   SpO2 98%   BMI 37.25 kg/m   Wt Readings from Last 3 Encounters:  05/01/24 217 lb (98.4 kg)  03/29/24 219 lb (99.3 kg)  01/06/20 177 lb (80.3 kg)   Physical Exam HENT:     Head: Normocephalic and atraumatic.     Right Ear: Tympanic membrane, ear canal and external ear normal.     Left Ear: Tympanic membrane, ear canal and external ear normal.     Nose: Nose normal.     Mouth/Throat:     Mouth: Mucous membranes are moist.     Pharynx: Oropharynx is clear.  Eyes:     Extraocular Movements: Extraocular movements intact.     Conjunctiva/sclera: Conjunctivae normal.     Pupils: Pupils are equal, round, and reactive to light.  Neck:     Thyroid: No thyroid mass, thyromegaly or thyroid tenderness.  Cardiovascular:     Rate and Rhythm: Normal rate and regular rhythm.     Pulses: Normal pulses.     Heart sounds: Normal heart sounds.  Pulmonary:     Effort: Pulmonary effort is normal.     Breath sounds: Normal breath sounds.  Chest:     Comments: Patient declined. Abdominal:     General: Bowel sounds are normal.     Palpations: Abdomen is soft.  Genitourinary:    Comments: Patient declined. Musculoskeletal:        General: Normal range of motion.     Right shoulder: Normal.     Left shoulder: Normal.     Right upper arm: Normal.     Left upper arm: Normal.     Right elbow: Normal.     Left elbow: Normal.     Right forearm: Normal.     Left forearm: Normal.     Right wrist: Normal.     Left wrist: Normal.      Right hand: Normal.     Left hand: Normal.     Cervical back: Normal, normal range of motion and neck supple.     Thoracic back: Normal.     Lumbar back: Normal.     Right hip: Normal.     Left hip: Normal.  Right upper leg: Normal.     Left upper leg: Normal.     Right knee: Normal.     Left knee: Normal.     Right lower leg: Normal.     Left lower leg: Normal.     Right ankle: Normal.     Left ankle: Normal.     Right foot: Normal.     Left foot: Normal.  Skin:    General: Skin is warm and dry.     Capillary Refill: Capillary refill takes less than 2 seconds.  Neurological:     General: No focal deficit present.     Mental Status: She is alert and oriented to person, place, and time.  Psychiatric:        Mood and Affect: Mood normal.        Behavior: Behavior normal.     ASSESSMENT AND PLAN: 1. Annual physical exam (Primary) - Counseled on 150 minutes of exercise per week as tolerated, healthy eating (including decreased daily intake of saturated fats, cholesterol, added sugars, sodium), STI prevention, and routine healthcare maintenance.  2. Screening for metabolic disorder - Routine screening.  - CMP14+EGFR  3. Screening for deficiency anemia - Routine screening.  - CBC  4. Diabetes mellitus screening - Routine screening.  - Hemoglobin A1c  5. Screening cholesterol level - Routine screening.  - Lipid panel  6. Thyroid disorder screen - Routine screening.  - TSH  7. Encounter for screening for HIV - Routine screening.  - HIV antibody (with reflex)  8. Need for hepatitis C screening test - Routine screening.  - Hepatitis C Antibody  9. Fatigue, unspecified type - Routine screening.  - Vitamin D , 25-hydroxy  10. Encounter for screening mammogram for malignant neoplasm of breast - Routine screening.  - MM Digital Screening; Future  11. Cervical cancer screening - Referral to Gynecology for evaluation/management. - Ambulatory referral to  Gynecology  12. Colon cancer screening - Referral to Gastroenterology for colon cancer screening by colonoscopy. - Ambulatory referral to Gastroenterology  13. Encounter for weight management 14. BMI 37.0-37.9, adult 15. Weight gain - Patient lost 2 pounds since previous office visit.  - Increase Phentermine  from 15 mg to 30 mg as prescribed. Counseled on medication adherence/adverse effects.  - Follow-up with primary provider in 4 weeks or sooner if needed. - phentermine  30 MG capsule; Take 1 capsule (30 mg total) by mouth every morning.  Dispense: 30 capsule; Refill: 0   Patient was given the opportunity to ask questions.  Patient verbalized understanding of the plan and was able to repeat key elements of the plan. Patient was given clear instructions to go to Emergency Department or return to medical center if symptoms don't improve, worsen, or new problems develop.The patient verbalized understanding.   Orders Placed This Encounter  Procedures   MM Digital Screening   HIV antibody (with reflex)   Hepatitis C Antibody   CBC   Lipid panel   CMP14+EGFR   Hemoglobin A1c   TSH   Vitamin D , 25-hydroxy   Ambulatory referral to Gynecology   Ambulatory referral to Gastroenterology     Requested Prescriptions   Signed Prescriptions Disp Refills   phentermine  30 MG capsule 30 capsule 0    Sig: Take 1 capsule (30 mg total) by mouth every morning.    Return in about 1 year (around 05/01/2025) for Physical per patient preference.  Greig JINNY Drones, NP

## 2024-05-02 LAB — CMP14+EGFR
ALT: 12 IU/L (ref 0–32)
AST: 33 IU/L (ref 0–40)
Albumin: 4.3 g/dL (ref 3.8–4.9)
Alkaline Phosphatase: 95 IU/L (ref 44–121)
BUN/Creatinine Ratio: 17 (ref 9–23)
BUN: 13 mg/dL (ref 6–24)
Bilirubin Total: 0.3 mg/dL (ref 0.0–1.2)
CO2: 23 mmol/L (ref 20–29)
Calcium: 9.4 mg/dL (ref 8.7–10.2)
Chloride: 106 mmol/L (ref 96–106)
Creatinine, Ser: 0.76 mg/dL (ref 0.57–1.00)
Globulin, Total: 2.4 g/dL (ref 1.5–4.5)
Glucose: 78 mg/dL (ref 70–99)
Potassium: 3.9 mmol/L (ref 3.5–5.2)
Sodium: 144 mmol/L (ref 134–144)
Total Protein: 6.7 g/dL (ref 6.0–8.5)
eGFR: 91 mL/min/1.73 (ref >59)

## 2024-05-02 LAB — LIPID PANEL
Chol/HDL Ratio: 1.7 ratio (ref 0.0–4.4)
Cholesterol, Total: 167 mg/dL (ref 100–199)
HDL: 96 mg/dL (ref >39)
LDL Chol Calc (NIH): 62 mg/dL (ref 0–99)
Triglycerides: 43 mg/dL (ref 0–149)
VLDL Cholesterol Cal: 9 mg/dL (ref 5–40)

## 2024-05-02 LAB — CBC
Hematocrit: 40.6 % (ref 34.0–46.6)
Hemoglobin: 12.8 g/dL (ref 11.1–15.9)
MCH: 29.8 pg (ref 26.6–33.0)
MCHC: 31.5 g/dL (ref 31.5–35.7)
MCV: 94 fL (ref 79–97)
Platelets: 226 x10E3/uL (ref 150–450)
RBC: 4.3 x10E6/uL (ref 3.77–5.28)
RDW: 12.9 % (ref 11.7–15.4)
WBC: 3.3 x10E3/uL — ABNORMAL LOW (ref 3.4–10.8)

## 2024-05-02 LAB — HEMOGLOBIN A1C
Est. average glucose Bld gHb Est-mCnc: 108 mg/dL
Hgb A1c MFr Bld: 5.4 % (ref 4.8–5.6)

## 2024-05-02 LAB — VITAMIN D 25 HYDROXY (VIT D DEFICIENCY, FRACTURES): Vit D, 25-Hydroxy: 26.5 ng/mL — ABNORMAL LOW (ref 30.0–100.0)

## 2024-05-02 LAB — HIV ANTIBODY (ROUTINE TESTING W REFLEX): HIV Screen 4th Generation wRfx: NONREACTIVE

## 2024-05-02 LAB — TSH: TSH: 0.461 u[IU]/mL (ref 0.450–4.500)

## 2024-05-02 LAB — HEPATITIS C ANTIBODY: Hep C Virus Ab: NONREACTIVE

## 2024-05-03 ENCOUNTER — Ambulatory Visit: Payer: Self-pay | Admitting: Family

## 2024-05-11 ENCOUNTER — Other Ambulatory Visit: Payer: Self-pay | Admitting: Physician Assistant

## 2024-05-28 ENCOUNTER — Ambulatory Visit

## 2024-06-04 ENCOUNTER — Encounter: Admitting: Family

## 2024-06-04 ENCOUNTER — Telehealth: Payer: Self-pay | Admitting: Family

## 2024-06-04 NOTE — Progress Notes (Signed)
 Erroneous encounter-disregard

## 2024-06-04 NOTE — Telephone Encounter (Signed)
 Called pt and left vm to call office back to reschedule missed appt

## 2024-06-08 ENCOUNTER — Ambulatory Visit

## 2024-07-24 ENCOUNTER — Ambulatory Visit: Admitting: Family

## 2024-07-31 ENCOUNTER — Ambulatory Visit (INDEPENDENT_AMBULATORY_CARE_PROVIDER_SITE_OTHER): Admitting: Family

## 2024-07-31 ENCOUNTER — Encounter: Payer: Self-pay | Admitting: Family

## 2024-07-31 ENCOUNTER — Ambulatory Visit: Payer: Self-pay | Admitting: Family

## 2024-07-31 VITALS — BP 138/83 | HR 72 | Temp 98.2°F | Resp 16 | Ht 64.0 in | Wt 212.0 lb

## 2024-07-31 DIAGNOSIS — R635 Abnormal weight gain: Secondary | ICD-10-CM | POA: Diagnosis not present

## 2024-07-31 DIAGNOSIS — M1990 Unspecified osteoarthritis, unspecified site: Secondary | ICD-10-CM

## 2024-07-31 DIAGNOSIS — Z131 Encounter for screening for diabetes mellitus: Secondary | ICD-10-CM | POA: Diagnosis not present

## 2024-07-31 DIAGNOSIS — R202 Paresthesia of skin: Secondary | ICD-10-CM | POA: Diagnosis not present

## 2024-07-31 LAB — POCT GLYCOSYLATED HEMOGLOBIN (HGB A1C): Hemoglobin A1C: 5 % (ref 4.0–5.6)

## 2024-07-31 MED ORDER — PHENTERMINE HCL 37.5 MG PO CAPS: 37.5000 mg | ORAL_CAPSULE | ORAL | 0 refills | Status: AC

## 2024-07-31 NOTE — Progress Notes (Signed)
 Patient ID: Kelly Flowers, female    DOB: 12/04/1965  MRN: [id number]  CC: Weight Check  Subjective: Kelly Flowers is a 58 y.o. female who presents for weight check.   Her concerns today include:  - Doing well on Phentermine , no issues/concerns. She watches what she eats. She exercises.  - Diabetes screening. - States bilateral feet tingling. Denies red flag symptoms. States considered if related to possible diabetes.  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 Denies recent trauma/injury and red flag symptoms.   Patient Active Problem List   Diagnosis Date Noted   S/P laparoscopic cholecystectomy 05/16/2019   DVT of lower extremity (deep venous thrombosis) (HCC) 10/16/2010   FATIGUE 07/10/2008   Obesity 03/28/2008   DEEP VENOUS THROMBOPHLEBITIS, LEFT, LEG, HX OF 03/28/2008     Current Outpatient Medications on File Prior to Visit  Medication Sig Dispense Refill   amitriptyline (ELAVIL) 10 MG tablet Take 10 mg by mouth at bedtime as needed for sleep.     azithromycin  (ZITHROMAX ) 250 MG tablet Take 1 tablet (250 mg total) by mouth daily. Take first 2 tablets together, then 1 every day until finished. 6 tablet 0   benzonatate  (TESSALON ) 100 MG capsule Take 1 capsule (100 mg total) by mouth every 8 (eight) hours. 21 capsule 0   cyclobenzaprine  (FLEXERIL ) 10 MG tablet Take 10 mg by mouth 3 (three) times daily as needed for muscle spasms.     EPINEPHrine 0.3 mg/0.3 mL IJ SOAJ injection Inject 0.3 mg into the muscle as needed for anaphylaxis.     ipratropium (ATROVENT ) 0.03 % nasal spray Place 2 sprays into both nostrils every 12 (twelve) hours. 30 mL 12   meloxicam  (MOBIC ) 15 MG tablet TAKE 1 TABLET (15 MG TOTAL) BY MOUTH DAILY. 30 tablet 0   promethazine -dextromethorphan (PROMETHAZINE -DM) 6.25-15 MG/5ML syrup Take 5 mLs by mouth 4 (four) times daily as needed for cough. 118 mL 0   Vitamin D , Ergocalciferol , (DRISDOL) 1.25 MG (50000 UNIT) CAPS capsule Take 50,000 Units by mouth every 7  (seven) days.     No current facility-administered medications on file prior to visit.    Allergies  Allergen Reactions   Peanut-Containing Drug Products Anaphylaxis   Prednisone  Itching    Social History   Socioeconomic History   Marital status: Single    Spouse name: Not on file   Number of children: Not on file   Years of education: Not on file   Highest education level: Not on file  Occupational History   Not on file  Tobacco Use   Smoking status: Never   Smokeless tobacco: Never  Vaping Use   Vaping status: Never Used  Substance and Sexual Activity   Alcohol use: Never   Drug use: Never   Sexual activity: Not on file  Other Topics Concern   Not on file  Social History Narrative   Not on file   Social Drivers of Health   Financial Resource Strain: Low Risk  (03/29/2024)   Overall Financial Resource Strain (CARDIA)    Difficulty of Paying Living Expenses: Not hard at all  Food Insecurity: No Food Insecurity (03/29/2024)   Hunger Vital Sign    Worried About Running Out of Food in the Last Year: Never true    Ran Out of Food in the Last Year: Never true  Transportation Needs: No Transportation Needs (03/29/2024)   PRAPARE - Administrator, Civil Service (Medical): No    Lack of Transportation (Non-Medical): No  Physical Activity: Sufficiently Active (03/29/2024)   Exercise Vital Sign    Days of Exercise per Week: 7 days    Minutes of Exercise per Session: 60 min  Stress: No Stress Concern Present (03/29/2024)   [employer] of Occupational Health - Occupational Stress Questionnaire    Feeling of Stress: Not at all  Social Connections: Moderately Isolated (03/29/2024)   Social Connection and Isolation Panel    Frequency of Communication with Friends and Family: More than three times a week    Frequency of Social Gatherings with Friends and Family: More than three times a week    Attends Religious Services: More than 4 times per year    Active  Member of [employer] or Organizations: No    Attends Banker Meetings: Never    Marital Status: Divorced  Catering Manager Violence: Not At Risk (03/29/2024)   Humiliation, Afraid, Rape, and Kick questionnaire    Fear of Current or Ex-Partner: No    Emotionally Abused: No    Physically Abused: No    Sexually Abused: No    History reviewed. No pertinent family history.  History reviewed. No pertinent surgical history.  ROS: Review of Systems Negative except as stated above  PHYSICAL EXAM: BP 138/83   Pulse 72   Temp 98.2 F (36.8 C) (Oral)   Resp 16   Ht 5' 4 (1.626 m)   Wt 212 lb (96.2 kg)   SpO2 98%   BMI 36.39 kg/m   Wt Readings from Last 3 Encounters:  07/31/24 212 lb (96.2 kg)  05/01/24 217 lb (98.4 kg)  03/29/24 219 lb (99.3 kg)   Physical Exam HENT:     Head: Normocephalic and atraumatic.     Nose: Nose normal.     Mouth/Throat:     Mouth: Mucous membranes are moist.     Pharynx: Oropharynx is clear.  Eyes:     Extraocular Movements: Extraocular movements intact.     Conjunctiva/sclera: Conjunctivae normal.     Pupils: Pupils are equal, round, and reactive to light.  Cardiovascular:     Rate and Rhythm: Normal rate and regular rhythm.     Pulses: Normal pulses.     Heart sounds: Normal heart sounds.  Pulmonary:     Effort: Pulmonary effort is normal.     Breath sounds: Normal breath sounds.  Musculoskeletal:        General: Normal range of motion.     Cervical back: Normal range of motion and neck supple.     Right hip: Normal.     Left hip: Normal.     Right upper leg: Normal.     Left upper leg: Normal.     Right knee: Normal.     Left knee: Normal.     Right lower leg: Normal.     Left lower leg: Normal.     Right ankle: Normal.     Left ankle: Normal.     Right foot: Normal.     Left foot: Normal.  Neurological:     General: No focal deficit present.     Mental Status: She is alert and oriented to person, place, and time.   Psychiatric:        Mood and Affect: Mood normal.        Behavior: Behavior normal.     ASSESSMENT AND PLAN: 1. Weight gain (Primary) - Patient lost 5 pounds since previous office visit.  - Increase Phentermine  from 30 mg to 37.5 mg  as prescribed. Counseled on medication adherence/adverse effects.  - Follow-up with primary provider in 4 weeks or sooner if needed.  - phentermine  37.5 MG capsule; Take 1 capsule (37.5 mg total) by mouth every morning.  Dispense: 30 capsule; Refill: 0  2. Diabetes mellitus screening - Routine screening.  - POCT glycosylated hemoglobin (Hb A1C); Future  3. Tingling of both feet - Patient declined pharmacological therapy.  - Routine screening.  - Follow-up with primary provider as scheduled.  - POCT glycosylated hemoglobin (Hb A1C); Future  4. Arthritis - Patient declined pharmacological therapy.  - Patient declined referral to specialist.  - Follow-up with primary provider as scheduled.    Patient was given the opportunity to ask questions.  Patient verbalized understanding of the plan and was able to repeat key elements of the plan. Patient was given clear instructions to go to Emergency Department or return to medical center if symptoms don't improve, worsen, or new problems develop.The patient verbalized understanding.   Orders Placed This Encounter  Procedures   POCT glycosylated hemoglobin (Hb A1C)     Requested Prescriptions   Signed Prescriptions Disp Refills   phentermine  37.5 MG capsule 30 capsule 0    Sig: Take 1 capsule (37.5 mg total) by mouth every morning.    Return in about 4 weeks (around 08/28/2024) for Follow-Up or next available weight check.  Greig JINNY Drones, NP

## 2024-07-31 NOTE — Progress Notes (Signed)
 Weight management, tingling in feet

## 2024-08-05 ENCOUNTER — Encounter: Payer: Self-pay | Admitting: Radiology

## 2024-09-09 ENCOUNTER — Encounter: Admitting: Family

## 2024-09-09 ENCOUNTER — Telehealth: Payer: Self-pay | Admitting: Family

## 2024-09-09 NOTE — Telephone Encounter (Signed)
 Called pt and left vm to call office back to reschedule missed appt

## 2024-09-09 NOTE — Progress Notes (Signed)
 Erroneous encounter-disregard

## 2025-05-01 ENCOUNTER — Encounter: Payer: Self-pay | Admitting: Family
